# Patient Record
Sex: Male | Born: 1985 | Race: Black or African American | Hispanic: No | Marital: Single | Smoking: Former smoker
Health system: Southern US, Community
[De-identification: ages and names within clinical notes are randomized; demographics above are authoritative.]

## PROBLEM LIST (undated history)

## (undated) DIAGNOSIS — Y249XXA Unspecified firearm discharge, undetermined intent, initial encounter: Secondary | ICD-10-CM

## (undated) DIAGNOSIS — W3400XA Accidental discharge from unspecified firearms or gun, initial encounter: Secondary | ICD-10-CM

## (undated) DIAGNOSIS — R011 Cardiac murmur, unspecified: Secondary | ICD-10-CM

## (undated) DIAGNOSIS — Z21 Asymptomatic human immunodeficiency virus [HIV] infection status: Secondary | ICD-10-CM

## (undated) DIAGNOSIS — B20 Human immunodeficiency virus [HIV] disease: Secondary | ICD-10-CM

## (undated) HISTORY — DX: Unspecified firearm discharge, undetermined intent, initial encounter: Y24.9XXA

## (undated) HISTORY — DX: Accidental discharge from unspecified firearms or gun, initial encounter: W34.00XA

---
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
## 2005-03-16 ENCOUNTER — Emergency Department (HOSPITAL_COMMUNITY): Admission: EM | Admit: 2005-03-16 | Discharge: 2005-03-16 | Payer: Self-pay | Admitting: Emergency Medicine

## 2008-07-01 ENCOUNTER — Emergency Department (HOSPITAL_COMMUNITY): Admission: EM | Admit: 2008-07-01 | Discharge: 2008-07-01 | Payer: Self-pay | Admitting: Emergency Medicine

## 2008-09-15 ENCOUNTER — Emergency Department (HOSPITAL_COMMUNITY): Admission: EM | Admit: 2008-09-15 | Discharge: 2008-09-15 | Payer: Self-pay | Admitting: Emergency Medicine

## 2016-10-11 ENCOUNTER — Ambulatory Visit (HOSPITAL_COMMUNITY)
Admission: EM | Admit: 2016-10-11 | Discharge: 2016-10-11 | Disposition: A | Discharge status: Home / Self Care | Payer: Self-pay | Attending: Family Medicine | Admitting: Family Medicine

## 2016-10-11 ENCOUNTER — Encounter (HOSPITAL_COMMUNITY): Payer: Self-pay | Admitting: Emergency Medicine

## 2016-10-11 DIAGNOSIS — K0889 Other specified disorders of teeth and supporting structures: Secondary | ICD-10-CM

## 2016-10-11 MED ORDER — AMOXICILLIN-POT CLAVULANATE 875-125 MG PO TABS: 1.0000 | ORAL_TABLET | Freq: Two times a day (BID) | ORAL | 0 refills | Status: DC

## 2016-10-11 NOTE — ED Triage Notes (Signed)
PT reports dental pain and swelling for 1 week.

## 2016-10-12 NOTE — ED Provider Notes (Signed)
  [facility]   [id number] 10/11/16 Arrival Time: [id number]  ASSESSMENT & PLAN:  1. Pain, dental     Meds ordered this encounter  Medications  . amoxicillin-clavulanate (AUGMENTIN) 875-125 MG tablet    Sig: Take 1 tablet by mouth every 12 (twelve) hours.    Dispense:  14 tablet    Refill:  0   If not improving within 48 hours he plans to f/u with dentist. OTC analgesics.  Reviewed expectations re: course of current medical issues. Questions answered. Outlined signs and symptoms indicating need for more acute intervention. Patient verbalized understanding. After Visit Summary given.   SUBJECTIVE:  Mercury D Aull is a 31 y.o. male who presents with complaint of dental pain for the past week. Upper rear L tooth. Thinks gum is swollen. Normal PO intake. Pain worse with chewing. Afebrile. No drainage from area. No regular dental care. OTC analgesics with mild and temporary relief.  ROS: As per HPI.   OBJECTIVE:  Vitals:   10/11/16 1920  BP: 138/84  Pulse: (!) 57  Resp: 16  Temp: 98.9 F (37.2 C)  TempSrc: Oral  SpO2: 97%  Weight: 190 lb (86.2 kg)  Height: 5\' 4"  (1.626 m)     General appearance: alert; no distress HEENT: teeth in poor repair; tenderness over rear upper L gum; mild erythema; no fluctuant areas Neck: supple without LAD Psychological:  alert and cooperative; normal mood and affect  No Known Allergies  PMHx, SurgHx, SocialHx, Medications, and Allergies were reviewed in the Visit Navigator and updated as appropriate.      Mardella LaymanHagler, Cydni Reddoch, MD 10/12/16 1002

## 2016-10-21 ENCOUNTER — Emergency Department (HOSPITAL_COMMUNITY)
Admission: EM | Admit: 2016-10-21 | Discharge: 2016-10-21 | Disposition: A | Discharge status: Home / Self Care | Payer: Self-pay | Attending: Emergency Medicine | Admitting: Emergency Medicine

## 2016-10-21 ENCOUNTER — Encounter (HOSPITAL_COMMUNITY): Payer: Self-pay | Admitting: Emergency Medicine

## 2016-10-21 DIAGNOSIS — F17210 Nicotine dependence, cigarettes, uncomplicated: Secondary | ICD-10-CM | POA: Insufficient documentation

## 2016-10-21 DIAGNOSIS — M25531 Pain in right wrist: Secondary | ICD-10-CM | POA: Insufficient documentation

## 2016-10-21 NOTE — ED Notes (Signed)
Pt c/o left wrist pain. Works as Public affairs consultantdishwasher. Has had worsening pain the last 2 weeks.

## 2016-10-21 NOTE — ED Provider Notes (Addendum)
[facility] Provider Note   CSN: [id number] Arrival date & time: 10/21/16  1046     History   Chief Complaint Chief Complaint  Patient presents with  . Wrist Pain    HPI Roberto Parker is a 31 y.o. male.  The history is provided by the patient. No language interpreter was used.  Wrist Pain  This is a new problem. The current episode started more than 1 week ago. The problem occurs constantly. The problem has been gradually worsening. Pertinent negatives include no chest pain. Nothing aggravates the symptoms. Nothing relieves the symptoms. The treatment provided no relief.   Pt reports wrist soreness for the past 2 weeks,  Pain with turning wrist.  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 No pertinent past medical history.  There are no active problems to display for this patient.   History reviewed. No pertinent surgical history.     Home Medications    Prior to Admission medications   Medication Sig Start Date End Date Taking? Authorizing Provider  amoxicillin-clavulanate (AUGMENTIN) 875-125 MG tablet Take 1 tablet by mouth every 12 (twelve) hours. 10/11/16   Mardella LaymanHagler, Brian, MD    Family History History reviewed. No pertinent family history.  Social History Social History  Substance Use Topics  . Smoking status: Current Every Day Smoker    Packs/day: 0.50    Types: Cigarettes  . Smokeless tobacco: Never Used  . Alcohol use No     Comment: sometimes     Allergies   Patient has no known allergies.   Review of Systems Review of Systems  Cardiovascular: Negative for chest pain.  All other systems reviewed and are negative.    Physical Exam Updated Vital Signs BP (!) 141/84 (BP Location: Right Arm)   Pulse (!) 58   Temp 98.4 F (36.9 C) (Oral)   Resp 16   SpO2 99%   Physical Exam  Constitutional: He appears well-developed and well-nourished.  HENT:  Head: Normocephalic and atraumatic.  Eyes: Conjunctivae are normal.   Neck: Neck supple.  Cardiovascular: Normal rate and regular rhythm.   No murmur heard. Pulmonary/Chest: Effort normal and breath sounds normal. No respiratory distress.  Abdominal: Soft. There is no tenderness.  Musculoskeletal: He exhibits tenderness. He exhibits no edema.  Tender left wrist,  Pain with range of motion,  nv and ns intact.   Neurological: He is alert.  Skin: Skin is warm and dry.  Psychiatric: He has a normal mood and affect.  Nursing note and vitals reviewed.    ED Treatments / Results  Labs (all labs ordered are listed, but only abnormal results are displayed) Labs Reviewed - No data to display  EKG  EKG Interpretation None       Radiology No results found.  Procedures Procedures (including critical care time)  Medications Ordered in ED Medications - No data to display   Initial Impression / Assessment and Plan / ED Course  I have reviewed the triage vital signs and the nursing notes.  Pertinent labs & imaging results that were available during my care of the patient were reviewed by me and considered in my medical decision making (see chart for details).     Wrist splint   Pt placed in velcro splint by Rn Ice Rest Ibuprofen   Final Clinical Impressions(s) / ED Diagnoses   Final diagnoses:  Right wrist pain    New Prescriptions Discharge Medication List as of 10/21/2016 11:45 AM    An  After Visit Summary was printed and given to the patient.   Osie Cheeks 10/21/16 1253    Doug Sou, MD 10/21/16 1723    Elson Areas, PA-C 11/03/16 1645

## 2016-10-21 NOTE — ED Triage Notes (Signed)
Pt to ER for evaluation of left wrist aching pain onset 2 weeks ago. 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 Limited movement due to pain, no numbness. Reports relief of pain with wrist flexion.

## 2016-10-21 NOTE — Discharge Instructions (Signed)
Ibuprofen for pain,  Wear wrist splint

## 2018-05-13 ENCOUNTER — Encounter (HOSPITAL_COMMUNITY): Payer: Self-pay | Admitting: Emergency Medicine

## 2018-05-13 ENCOUNTER — Other Ambulatory Visit: Payer: Self-pay

## 2018-05-13 ENCOUNTER — Emergency Department (HOSPITAL_COMMUNITY)
Admission: EM | Admit: 2018-05-13 | Discharge: 2018-05-13 | Disposition: A | Discharge status: Home / Self Care | Payer: Self-pay | Attending: Emergency Medicine | Admitting: Emergency Medicine

## 2018-05-13 DIAGNOSIS — R0989 Other specified symptoms and signs involving the circulatory and respiratory systems: Secondary | ICD-10-CM | POA: Insufficient documentation

## 2018-05-13 DIAGNOSIS — F17210 Nicotine dependence, cigarettes, uncomplicated: Secondary | ICD-10-CM | POA: Insufficient documentation

## 2018-05-13 MED ORDER — ALBUTEROL SULFATE HFA 108 (90 BASE) MCG/ACT IN AERS: 1.0000 | INHALATION_SPRAY | Freq: Four times a day (QID) | RESPIRATORY_TRACT | 0 refills | Status: DC | PRN

## 2018-05-13 NOTE — Discharge Instructions (Signed)
You have been seen today for sore throat and chest congestion. Please read and follow all provided instructions. Return to the emergency room for worsening condition or new concerning symptoms.    1. Medications:  Prescription sent to your pharmacy for albuterol inhaler. This is for your shortness of breath.Please use as prescribed. Continue usual home medications. Take medications as prescribed. Please review all of the medicines and only take them if you do not have an allergy to them.  2. Treatment: rest, drink plenty of fluids 3. Follow Up: Please follow up with your primary doctor in 2-5 days for discussion of your diagnoses and further evaluation after today's visit; Call today to arrange your follow up.  If you do not have a primary care doctor use the resource guide provided to find one;   It is also a possibility that you have an allergic reaction to any of the medicines that you have been prescribed - Everybody reacts differently to medications and while MOST people have no trouble with most medicines, you may have a reaction such as nausea, vomiting, rash, swelling, shortness of breath. If this is the case, please stop taking the medicine immediately and contact your physician.  ?

## 2018-05-13 NOTE — ED Provider Notes (Signed)
[facility]-EMERGENCY DEPT Provider Note   CSN: [id number] Arrival date & time: 05/13/18  1104    History   Chief Complaint Chief Complaint  Patient presents with  . feels like something stuck in throat    HPI Roberto Parker is a 33 y.o. male otherwise healthy male presenting to emergency department today with chief complaint of sore throat x3 days.  More specifically patient says " it feels like something is stuck in my throat."  Patient reports he felt like he had the flu 2 weeks ago, did symptomatic treatment and mostly improved but has continued to have cough.  The cough is nonproductive, intermittent.  He states he still feels congested in his chest and has intermittent shortness of breath.  Denies modifying factors.  Patient describes this pain in his throat as feeling like there is something in it.  He reports when he swallows it feels like the liquid is going around an object instead of straight down like normal.  He is able to tolerate p.o. intake. He states he has to spit up a lot because of this feeling in his throat.  He has not taken any medication for his congestion prior to arrival.  He is unsure if there is a piece of chicken or food stuck in his throat. He denies fever, chills, nausea, vomiting. History provided by patient.     History reviewed. No pertinent past medical history.  There are no active problems to display for this patient.   History reviewed. No pertinent surgical history.      Home Medications    Prior to Admission medications   Medication Sig Start Date End Date Taking? Authorizing Provider  albuterol (PROVENTIL HFA;VENTOLIN HFA) 108 (90 Base) MCG/ACT inhaler Inhale 1-2 puffs into the lungs every 6 (six) hours as needed for wheezing or shortness of breath. 05/13/18   Albrizze, Kaitlyn E, PA-C  amoxicillin-clavulanate (AUGMENTIN) 875-125 MG tablet Take 1 tablet by mouth every 12 (twelve) hours. 10/11/16   Mardella Layman, MD     Family History No family history on file.  Social History Social History   Tobacco Use  . Smoking status: Current Every Day Smoker    Packs/day: 0.50    Types: Cigarettes  . Smokeless tobacco: Never Used  Substance Use Topics  . Alcohol use: No    Comment: sometimes  . Drug use: Yes    Types: Marijuana     Allergies   Patient has no known allergies.   Review of Systems Review of Systems  Constitutional: Negative for chills and fever.  HENT: Positive for congestion, sore throat and trouble swallowing. Negative for drooling, ear discharge, ear pain, facial swelling, sinus pain and voice change.   Respiratory: Positive for cough and shortness of breath. Negative for chest tightness.   Cardiovascular: Negative for chest pain.  Gastrointestinal: Negative for abdominal pain, diarrhea, nausea and vomiting.  All other systems reviewed and are negative.    Physical Exam Updated Vital Signs BP 132/83 (BP Location: Left Arm)   Pulse 66   Temp 98.7 F (37.1 C) (Oral)   Resp 20   SpO2 100%   Physical Exam Vitals signs and nursing note reviewed.  Constitutional:      Appearance: He is well-developed. He is not ill-appearing or toxic-appearing.  HENT:     Head: Normocephalic and atraumatic.     Nose: Nose normal.     Mouth/Throat:     Mouth: Mucous membranes are moist.  Pharynx: Oropharynx is clear. No oropharyngeal exudate or posterior oropharyngeal erythema.  Eyes:     General: No scleral icterus.       Right eye: No discharge.        Left eye: No discharge.     Conjunctiva/sclera: Conjunctivae normal.  Neck:     Musculoskeletal: Normal range of motion.  Cardiovascular:     Rate and Rhythm: Normal rate and regular rhythm.     Pulses: Normal pulses.     Heart sounds: Normal heart sounds.  Pulmonary:     Effort: Pulmonary effort is normal. No respiratory distress.     Breath sounds: No stridor. No wheezing, rhonchi or rales.     Comments: Lungs are clear  to auscultation in all fields.  Patient is speaking in full sentences, no accessory muscle use. Chest:     Chest wall: No tenderness.  Abdominal:     General: There is no distension.     Palpations: Abdomen is soft.  Musculoskeletal: Normal range of motion.  Skin:    General: Skin is warm and dry.  Neurological:     Mental Status: He is oriented to person, place, and time.     Comments: Fluent speech, no facial droop.  Psychiatric:        Behavior: Behavior normal.      ED Treatments / Results  Labs (all labs ordered are listed, but only abnormal results are displayed) Labs Reviewed - No data to display  EKG None  Radiology No results found.  Procedures Procedures (including critical care time)  Medications Ordered in ED Medications - No data to display   Initial Impression / Assessment and Plan / ED Course  I have reviewed the triage vital signs and the nursing notes.  Pertinent labs & imaging results that were available during my care of the patient were reviewed by me and considered in my medical decision making (see chart for details).   Patient is afebrile, well-appearing, no hypoxia.  He reports recent flu infection and has continued to have cough and chest congestion.  He thinks he has something stuck in his throat.  He admits to tolerating both p.o. fluid and food. DDX includes bronchitis, URI, less likely pneumonia, flu, food bolus impaction. patient states he does not want to stay for the x-ray.  He is frustrated that he has been here for 5 hours and states he has to leave to get back to work.  My work-up included a chest x-ray however he would rather leave before the imaging.  He is concerned for this ongoing congestion and wants treatment.  I recommended over-the-counter options such as a Mucinex cough and chest congestion or Mucinex sinus.  Prescription sent at discharge for albuterol inhaler given his intermittent shortness of breath. Patient is  hemodynamically stable, in NAD, and able to ambulate in the ED. Patient is comfortable with above plan and is stable for discharge at this time. All questions were answered prior to disposition. Strict return precautions for returning to the ED were discussed. Encouraged follow up with PCP.      Final Clinical Impressions(s) / ED Diagnoses   Final diagnoses:  Chest congestion    ED Discharge Orders         Ordered    albuterol (PROVENTIL HFA;VENTOLIN HFA) 108 (90 Base) MCG/ACT inhaler  Every 6 hours PRN     05/13/18 1623           Sherene Sires, PA-C 05/13/18 1902  Samuel Jester, DO 05/18/18 1843

## 2018-05-13 NOTE — ED Triage Notes (Signed)
Pt reports feels like something is stuck in his throat for a couple days after eating a piece of chicken. Reports "been spitting up cold and when I lay down feels like something coming up throat".

## 2018-10-23 ENCOUNTER — Other Ambulatory Visit: Payer: Self-pay

## 2018-10-23 ENCOUNTER — Emergency Department (HOSPITAL_COMMUNITY)
Admission: EM | Admit: 2018-10-23 | Discharge: 2018-10-23 | Disposition: A | Discharge status: Home / Self Care | Payer: Self-pay | Attending: Emergency Medicine | Admitting: Emergency Medicine

## 2018-10-23 ENCOUNTER — Emergency Department (HOSPITAL_COMMUNITY): Payer: Self-pay

## 2018-10-23 ENCOUNTER — Encounter (HOSPITAL_COMMUNITY): Payer: Self-pay | Admitting: Emergency Medicine

## 2018-10-23 DIAGNOSIS — W3400XA Accidental discharge from unspecified firearms or gun, initial encounter: Secondary | ICD-10-CM | POA: Insufficient documentation

## 2018-10-23 DIAGNOSIS — Y939 Activity, unspecified: Secondary | ICD-10-CM | POA: Insufficient documentation

## 2018-10-23 DIAGNOSIS — Y929 Unspecified place or not applicable: Secondary | ICD-10-CM | POA: Insufficient documentation

## 2018-10-23 DIAGNOSIS — Y999 Unspecified external cause status: Secondary | ICD-10-CM | POA: Insufficient documentation

## 2018-10-23 DIAGNOSIS — Z9189 Other specified personal risk factors, not elsewhere classified: Secondary | ICD-10-CM

## 2018-10-23 DIAGNOSIS — F17210 Nicotine dependence, cigarettes, uncomplicated: Secondary | ICD-10-CM | POA: Insufficient documentation

## 2018-10-23 DIAGNOSIS — S71132A Puncture wound without foreign body, left thigh, initial encounter: Secondary | ICD-10-CM | POA: Insufficient documentation
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
## 2019-02-20 ENCOUNTER — Encounter (HOSPITAL_COMMUNITY): Payer: Self-pay | Admitting: Emergency Medicine

## 2019-02-20 ENCOUNTER — Ambulatory Visit (HOSPITAL_COMMUNITY)
Admission: EM | Admit: 2019-02-20 | Discharge: 2019-02-20 | Disposition: A | Discharge status: Home / Self Care | Payer: Self-pay | Attending: Family Medicine | Admitting: Family Medicine

## 2019-02-20 ENCOUNTER — Other Ambulatory Visit: Payer: Self-pay

## 2019-02-20 DIAGNOSIS — L089 Local infection of the skin and subcutaneous tissue, unspecified: Secondary | ICD-10-CM

## 2019-02-20 DIAGNOSIS — B9689 Other specified bacterial agents as the cause of diseases classified elsewhere: Secondary | ICD-10-CM

## 2019-02-20 DIAGNOSIS — B353 Tinea pedis: Secondary | ICD-10-CM

## 2019-02-20 HISTORY — DX: Cardiac murmur, unspecified: R01.1

## 2019-02-20 MED ORDER — DOXYCYCLINE HYCLATE 100 MG PO CAPS: 100.0000 mg | ORAL_CAPSULE | Freq: Two times a day (BID) | ORAL | 0 refills | Status: DC

## 2019-02-20 MED ORDER — CLOTRIMAZOLE 1 % EX CREA: TOPICAL_CREAM | CUTANEOUS | 0 refills | Status: DC

## 2019-02-20 NOTE — ED Provider Notes (Signed)
[facility]   [id number] 02/20/19 Arrival Time: 1443  ASSESSMENT & PLAN:  1. Tinea pedis of right foot   2. Bacterial skin infection     No indication for imaging of foot at this time. No trauma reported. Discussed. Suspect tinea pedis with developing cellulitis.  Begin: Meds ordered this encounter  Medications  . doxycycline (VIBRAMYCIN) 100 MG capsule    Sig: Take 1 capsule (100 mg total) by mouth 2 (two) times daily.    Dispense:  20 capsule    Refill:  0  . clotrimazole (LOTRIMIN) 1 % cream    Sig: Apply to affected area 2 times daily for athletes foot.    Dispense:  15 g    Refill:  0    Close home observation.  Recommend: Follow-up Information    [facility].   Specialty: Urgent Care Why: If worsening or failing to improve as anticipated. Contact information: [address] [place] [address] [phone]          OTC analgesics if needed. Reviewed expectations re: course of current medical issues. Questions answered. Outlined signs and symptoms indicating need for more acute intervention. Patient verbalized understanding. After Visit Summary given.  SUBJECTIVE: History from: patient. Roberto Parker is a 33 y.o. male who reports pain and swelling around his R fifth toe. First noted approx 2 days ago. "Think there's a small cut under my toe". No bleeding or drainage. Afebrile. No injury/trauma reported. Afebrile. Wearing his normal shoes. Ambulatory without difficulty. No specific aggravating or alleviating factors reported. No extremity sensation changes or weakness. No home treatment. Some itching between toes; sporadic; "for awhile".  History reviewed. No pertinent surgical history.   ROS: As per HPI. All other systems negative.    OBJECTIVE:  Vitals:   02/20/19 1640  BP: 116/72  Pulse: (!) 59  Resp: 18  Temp: 98.6 F (37 C)  TempSrc: Oral  SpO2: 97%    General appearance:  alert; no distress HEENT: [place]; AT Neck: supple with FROM Resp: unlabored respirations Extremities: . RLE: warm with well perfused appearance; fairly well localized mild tenderness around the base of his R 5th toe with dry and slightly macerated skin; no bleeding or drainage; skin around toe with mild erythema and significant warmth to touch; normal capillary refill and distal sensation; no bruising Skin: warm and dry; no visible rashes (except as noted above) Neurologic: gait normal; normal reflexes of RLE; normal sensation of RLE; normal strength of RLE Psychological: alert and cooperative; normal mood and affect   No Known Allergies  Past Medical History:  Diagnosis Date  . Murmur    Social History   Socioeconomic History  . Marital status: Single    Spouse name: Not on file  . Number of children: Not on file  . Years of education: Not on file  . Highest education level: Not on file  Occupational History  . Not on file  Tobacco Use  . Smoking status: Former Smoker    Packs/day: 0.50    Types: Cigarettes  . Smokeless tobacco: Never Used  Substance and Sexual Activity  . Alcohol use: No    Comment: sometimes  . Drug use: Yes    Types: Marijuana  . Sexual activity: Not on file  Other Topics Concern  . Not on file  Social History Narrative  . Not on file   Social Determinants of Health   Financial Resource Strain:   . Difficulty  of Paying Living Expenses: Not on file  Food Insecurity:   . Worried About Charity fundraiser in the Last Year: Not on file  . Ran Out of Food in the Last Year: Not on file  Transportation Needs:   . Lack of Transportation (Medical): Not on file  . Lack of Transportation (Non-Medical): Not on file  Physical Activity:   . Days of Exercise per Week: Not on file  . Minutes of Exercise per Session: Not on file  Stress:   . Feeling of Stress : Not on file  Social Connections:   . Frequency of Communication with Friends and Family: Not on  file  . Frequency of Social Gatherings with Friends and Family: Not on file  . Attends Religious Services: Not on file  . Active Member of Clubs or Organizations: Not on file  . Attends Archivist Meetings: Not on file  . Marital Status: Not on file   Family History  Problem Relation Age of Onset  . Healthy Mother   . Healthy Father    History reviewed. No pertinent surgical history.    Vanessa Kick, MD 02/20/19 [phone]

## 2019-02-20 NOTE — ED Triage Notes (Signed)
Small cut reported under left toe, pain for 2 days.  Increased swelling today

## 2019-05-02 ENCOUNTER — Ambulatory Visit (HOSPITAL_COMMUNITY)
Admission: EM | Admit: 2019-05-02 | Discharge: 2019-05-02 | Disposition: A | Discharge status: Home / Self Care | Payer: Self-pay | Attending: Family Medicine | Admitting: Family Medicine

## 2019-05-02 ENCOUNTER — Encounter (HOSPITAL_COMMUNITY): Payer: Self-pay | Admitting: Emergency Medicine

## 2019-05-02 ENCOUNTER — Other Ambulatory Visit: Payer: Self-pay

## 2019-05-02 DIAGNOSIS — Z113 Encounter for screening for infections with a predominantly sexual mode of transmission: Secondary | ICD-10-CM | POA: Insufficient documentation

## 2019-05-02 DIAGNOSIS — R220 Localized swelling, mass and lump, head: Secondary | ICD-10-CM | POA: Insufficient documentation

## 2019-05-02 LAB — HIV ANTIBODY (ROUTINE TESTING W REFLEX): HIV Screen 4th Generation wRfx: REACTIVE — AB

## 2019-05-02 NOTE — Discharge Instructions (Addendum)
Your STD tests are pending.  If your test results are positive, we will call you.  You may need additional treatment and your partner(s) may also need treatment.      

## 2019-05-02 NOTE — ED Provider Notes (Signed)
[facility]    CSN: [id number] Arrival date & time: 05/02/19  1146      History   Chief Complaint Chief Complaint  Patient presents with  . Lip Problem    HPI Roberto Parker is a 34 y.o. male.   Reports that he had oral sex with a partner x2 days ago and noticed that 1 day ago he had a lesion on his bottom left lip.  Reports that it burns at times.  He has been keeping Vaseline on it to keep it moisturized.  Reports that he did not see any lesions on his partner at the time.  Requesting further STD testing today as well.  Denies fever, nausea, vomiting, diarrhea, headache, sore throat, shortness of breath, rash, other symptoms.  ROS per HPI  The history is provided by the patient.    Past Medical History:  Diagnosis Date  . Murmur     There are no problems to display for this patient.   History reviewed. No pertinent surgical history.     Home Medications    Prior to Admission medications   Medication Sig Start Date End Date Taking? Authorizing Provider  albuterol (PROVENTIL HFA;VENTOLIN HFA) 108 (90 Base) MCG/ACT inhaler Inhale 1-2 puffs into the lungs every 6 (six) hours as needed for wheezing or shortness of breath. Patient not taking: Reported on 10/23/2018 05/13/18   Albrizze, Caroleen Hamman, PA-C  clotrimazole (LOTRIMIN) 1 % cream Apply to affected area 2 times daily for athletes foot. 02/20/19   Mardella Layman, MD  doxycycline (VIBRAMYCIN) 100 MG capsule Take 1 capsule (100 mg total) by mouth 2 (two) times daily. 02/20/19   Mardella Layman, MD    Family History Family History  Problem Relation Age of Onset  . Healthy Mother   . Healthy Father     Social History Social History   Tobacco Use  . Smoking status: Former Smoker    Packs/day: 0.50    Types: Cigarettes  . Smokeless tobacco: Never Used  Substance Use Topics  . Alcohol use: No    Comment: sometimes  . Drug use: Yes    Types: Marijuana     Allergies   Patient has no known  allergies.   Review of Systems Review of Systems   Physical Exam Triage Vital Signs ED Triage Vitals [05/02/19 1238]  Enc Vitals Group     BP 136/81     Pulse Rate 79     Resp 18     Temp 98.6 F (37 C)     Temp Source Oral     SpO2 100 %     Weight      Height      Head Circumference      Peak Flow      Pain Score 0     Pain Loc      Pain Edu?      Excl. in GC?    No data found.  Updated Vital Signs BP 136/81   Pulse 79   Temp 98.6 F (37 C) (Oral)   Resp 18   SpO2 100%   Visual Acuity Right Eye Distance:   Left Eye Distance:   Bilateral Distance:    Right Eye Near:   Left Eye Near:    Bilateral Near:     Physical Exam Vitals and nursing note reviewed.  Constitutional:      Appearance: He is well-developed.  HENT:     Head: Normocephalic and atraumatic.  Comments: Lesion swabbed    Nose: Nose normal.     Mouth/Throat:     Lips: Lesions present.     Mouth: Mucous membranes are moist.   Eyes:     Conjunctiva/sclera: Conjunctivae normal.  Cardiovascular:     Rate and Rhythm: Normal rate and regular rhythm.     Heart sounds: No murmur.  Pulmonary:     Effort: Pulmonary effort is normal. No respiratory distress.     Breath sounds: Normal breath sounds.  Abdominal:     General: Bowel sounds are normal.     Palpations: Abdomen is soft.     Tenderness: There is no abdominal tenderness.  Musculoskeletal:     Cervical back: Neck supple.  Skin:    General: Skin is warm and dry.     Capillary Refill: Capillary refill takes less than 2 seconds.  Neurological:     General: No focal deficit present.     Mental Status: He is alert and oriented to person, place, and time.  Psychiatric:        Mood and Affect: Mood normal.        Behavior: Behavior normal.      UC Treatments / Results  Labs (all labs ordered are listed, but only abnormal results are displayed) Labs Reviewed  HSV CULTURE AND TYPING  RPR  HIV ANTIBODY (ROUTINE TESTING W  REFLEX)  CYTOLOGY, (ORAL, ANAL, URETHRAL) ANCILLARY ONLY    EKG   Radiology No results found.  Procedures Procedures (including critical care time)  Medications Ordered in UC Medications - No data to display  Initial Impression / Assessment and Plan / UC Course  I have reviewed the triage vital signs and the nursing notes.  Pertinent labs & imaging results that were available during my care of the patient were reviewed by me and considered in my medical decision making (see chart for details).    Herpes swab to left lower lip.  Will call with results.  Abdomen swab obtained, will call with results.  Instructed to abstain from sexual intercourse or oral sex, until results are back.  Did not treat today, if anything comes back positive we will treat accordingly.  Instructed on when to go to the ER if needed. Final Clinical Impressions(s) / UC Diagnoses   Final diagnoses:  Screening for STDs (sexually transmitted diseases)  Lip swelling     Discharge Instructions     Your STD tests are pending.  If your test results are positive, we will call you.  You may need additional treatment and your partner(s) may also need treatment.         ED Prescriptions    None     I have reviewed the PDMP during this encounter.   Faustino Congress, NP 05/02/19 1354

## 2019-05-02 NOTE — ED Triage Notes (Addendum)
Pt c/o lower lip irritation since yesterday. Pt states it popped up after he had "sexual relations with somebody". Pt requesting all std testing.

## 2019-05-03 LAB — CYTOLOGY, (ORAL, ANAL, URETHRAL) ANCILLARY ONLY
Chlamydia: NEGATIVE
Neisseria Gonorrhea: NEGATIVE
Trichomonas: NEGATIVE

## 2019-05-03 LAB — RPR: RPR Ser Ql: NONREACTIVE

## 2019-05-05 ENCOUNTER — Telehealth: Payer: Self-pay | Admitting: *Deleted

## 2019-05-05 LAB — HSV CULTURE AND TYPING

## 2019-05-05 LAB — HIV-1/2 AB - DIFFERENTIATION
HIV 1 Ab: POSITIVE — AB
HIV 2 Ab: NEGATIVE

## 2019-05-05 NOTE — Telephone Encounter (Signed)
Sent referral to [facility]. Sheena Simonis M, RN  

## 2019-05-05 NOTE — Telephone Encounter (Signed)
-----   Message from Gardiner Barefoot, MD sent at 05/05/2019  2:55 PM EST ----- New positive B20.  Needs to be sent to [facility].  thanks

## 2019-05-06 ENCOUNTER — Telehealth: Payer: Self-pay | Admitting: *Deleted

## 2019-05-06 DIAGNOSIS — Z79899 Other long term (current) drug therapy: Secondary | ICD-10-CM

## 2019-05-06 DIAGNOSIS — Z113 Encounter for screening for infections with a predominantly sexual mode of transmission: Secondary | ICD-10-CM

## 2019-05-06 DIAGNOSIS — B20 Human immunodeficiency virus [HIV] disease: Secondary | ICD-10-CM

## 2019-05-06 NOTE — Telephone Encounter (Signed)
Patient called for an appointment, states he was given our phone number from Urgent Care and [facility] after he was told he was HIV+.  RN spoke with patient, answered a few of his questions, scheduled him for NEW B20 appointments. Transferred him to [facility] [place] for advice on what to bring to his financial counseling appointment. 05/07/19 - financial and labs,  05/21/19 - Dr Orvan Falconer and [facility]. Patient given our phone number and address. Lab orders placed. Andree Coss, RN

## 2019-05-07 ENCOUNTER — Other Ambulatory Visit: Payer: Self-pay

## 2019-05-07 ENCOUNTER — Encounter: Payer: Self-pay | Admitting: Internal Medicine

## 2019-05-07 ENCOUNTER — Ambulatory Visit: Payer: Self-pay

## 2019-05-07 DIAGNOSIS — Z113 Encounter for screening for infections with a predominantly sexual mode of transmission: Secondary | ICD-10-CM

## 2019-05-07 DIAGNOSIS — Z79899 Other long term (current) drug therapy: Secondary | ICD-10-CM

## 2019-05-07 DIAGNOSIS — B20 Human immunodeficiency virus [HIV] disease: Secondary | ICD-10-CM

## 2019-05-07 LAB — URINALYSIS
Bilirubin Urine: NEGATIVE
Glucose, UA: NEGATIVE
Hgb urine dipstick: NEGATIVE
Ketones, ur: NEGATIVE
Nitrite: NEGATIVE
Protein, ur: NEGATIVE
Specific Gravity, Urine: 1.012 (ref 1.001–1.03)
pH: 5.5 (ref 5.0–8.0)

## 2019-05-08 LAB — T-HELPER CELL (CD4) - (RCID CLINIC ONLY)
CD4 % Helper T Cell: 21 % — ABNORMAL LOW (ref 33–65)
CD4 T Cell Abs: 451 /uL (ref 400–1790)

## 2019-05-08 LAB — URINE CYTOLOGY ANCILLARY ONLY
Chlamydia: NEGATIVE
Comment: NEGATIVE
Comment: NORMAL
Neisseria Gonorrhea: NEGATIVE

## 2019-05-16 LAB — CBC WITH DIFFERENTIAL/PLATELET
Absolute Monocytes: 397 cells/uL (ref 200–950)
Basophils Absolute: 12 cells/uL (ref 0–200)
Basophils Relative: 0.2 %
Eosinophils Absolute: 31 cells/uL (ref 15–500)
Eosinophils Relative: 0.5 %
HCT: 39.9 % (ref 38.5–50.0)
Hemoglobin: 13.5 g/dL (ref 13.2–17.1)
Lymphs Abs: 2318 cells/uL (ref 850–3900)
MCH: 31.1 pg (ref 27.0–33.0)
MCHC: 33.8 g/dL (ref 32.0–36.0)
MCV: 91.9 fL (ref 80.0–100.0)
MPV: 10.3 fL (ref 7.5–12.5)
Monocytes Relative: 6.5 %
Neutro Abs: 3343 cells/uL (ref 1500–7800)
Neutrophils Relative %: 54.8 %
Platelets: 230 10*3/uL (ref 140–400)
RBC: 4.34 10*6/uL (ref 4.20–5.80)
RDW: 13.9 % (ref 11.0–15.0)
Total Lymphocyte: 38 %
WBC: 6.1 10*3/uL (ref 3.8–10.8)

## 2019-05-16 LAB — COMPLETE METABOLIC PANEL WITH GFR
AG Ratio: 0.8 (calc) — ABNORMAL LOW (ref 1.0–2.5)
ALT: 27 U/L (ref 9–46)
AST: 31 U/L (ref 10–40)
Albumin: 3.7 g/dL (ref 3.6–5.1)
Alkaline phosphatase (APISO): 67 U/L (ref 36–130)
BUN: 14 mg/dL (ref 7–25)
CO2: 23 mmol/L (ref 20–32)
Calcium: 8.8 mg/dL (ref 8.6–10.3)
Chloride: 106 mmol/L (ref 98–110)
Creat: 1.03 mg/dL (ref 0.60–1.35)
GFR, Est African American: 109 mL/min/{1.73_m2} (ref >=60)
GFR, Est Non African American: 94 mL/min/{1.73_m2} (ref >=60)
Globulin: 4.4 g/dL (calc) — ABNORMAL HIGH (ref 1.9–3.7)
Glucose, Bld: 147 mg/dL — ABNORMAL HIGH (ref 65–99)
Potassium: 3.8 mmol/L (ref 3.5–5.3)
Sodium: 135 mmol/L (ref 135–146)
Total Bilirubin: 0.3 mg/dL (ref 0.2–1.2)
Total Protein: 8.1 g/dL (ref 6.1–8.1)

## 2019-05-16 LAB — HIV-1 RNA ULTRAQUANT REFLEX TO GENTYP+
HIV 1 RNA Quant: 24300 copies/mL — ABNORMAL HIGH
HIV-1 RNA Quant, Log: 4.39 Log copies/mL — ABNORMAL HIGH

## 2019-05-16 LAB — LIPID PANEL
Cholesterol: 135 mg/dL (ref <200)
HDL: 38 mg/dL — ABNORMAL LOW (ref >=40)
LDL Cholesterol (Calc): 77 mg/dL (calc)
Non-HDL Cholesterol (Calc): 97 mg/dL (calc) (ref <130)
Total CHOL/HDL Ratio: 3.6 (calc) (ref <5.0)
Triglycerides: 110 mg/dL (ref <150)

## 2019-05-16 LAB — QUANTIFERON-TB GOLD PLUS
Mitogen-NIL: >10 IU/mL
NIL: 0.08 IU/mL
QuantiFERON-TB Gold Plus: NEGATIVE
TB1-NIL: 0.01 IU/mL
TB2-NIL: <0 IU/mL

## 2019-05-16 LAB — HIV-1 GENOTYPE: HIV-1 Genotype: DETECTED — AB

## 2019-05-16 LAB — RPR: RPR Ser Ql: NONREACTIVE

## 2019-05-16 LAB — HIV ANTIBODY (ROUTINE TESTING W REFLEX): HIV 1&2 Ab, 4th Generation: REACTIVE — AB

## 2019-05-16 LAB — HLA B*5701: HLA-B*5701 w/rflx HLA-B High: NEGATIVE

## 2019-05-16 LAB — HIV-1/2 AB - DIFFERENTIATION
HIV-1 antibody: POSITIVE — AB
HIV-2 Ab: NEGATIVE

## 2019-05-16 LAB — HEPATITIS B CORE ANTIBODY, TOTAL: Hep B Core Total Ab: NONREACTIVE

## 2019-05-16 LAB — HEPATITIS B SURFACE ANTIGEN: Hepatitis B Surface Ag: NONREACTIVE

## 2019-05-16 LAB — HEPATITIS B SURFACE ANTIBODY,QUALITATIVE: Hep B S Ab: REACTIVE — AB

## 2019-05-16 LAB — HEPATITIS C ANTIBODY
Hepatitis C Ab: NONREACTIVE
SIGNAL TO CUT-OFF: 0.13 (ref <1.00)

## 2019-05-16 LAB — HEPATITIS A ANTIBODY, TOTAL: Hepatitis A AB,Total: NONREACTIVE

## 2019-05-20 ENCOUNTER — Telehealth: Payer: Self-pay

## 2019-05-20 NOTE — Telephone Encounter (Signed)
COVID-19 Pre-Screening Questions:05/20/19  Do you currently have a fever (>100 F), chills or unexplained body aches?NO  Are you currently experiencing new cough, shortness of breath, sore throat, runny nose? NO .  Have you recently travelled outside the state of [place] in the last 14 days?NO .  Have you been in contact with someone that is currently pending confirmation of Covid19 testing or has been confirmed to have the Covid19 virus?  NO  **If the patient answers NO to ALL questions -  advise the patient to please call the clinic before coming to the office should any symptoms develop.     

## 2019-05-21 ENCOUNTER — Encounter: Payer: Self-pay | Admitting: Internal Medicine

## 2019-05-21 ENCOUNTER — Ambulatory Visit (INDEPENDENT_AMBULATORY_CARE_PROVIDER_SITE_OTHER): Payer: Self-pay | Admitting: Pharmacist

## 2019-05-21 ENCOUNTER — Telehealth: Payer: Self-pay | Admitting: Pharmacy Technician

## 2019-05-21 ENCOUNTER — Ambulatory Visit (INDEPENDENT_AMBULATORY_CARE_PROVIDER_SITE_OTHER): Payer: Self-pay | Admitting: Internal Medicine

## 2019-05-21 ENCOUNTER — Other Ambulatory Visit: Payer: Self-pay

## 2019-05-21 DIAGNOSIS — B001 Herpesviral vesicular dermatitis: Secondary | ICD-10-CM

## 2019-05-21 DIAGNOSIS — B20 Human immunodeficiency virus [HIV] disease: Secondary | ICD-10-CM

## 2019-05-21 DIAGNOSIS — R739 Hyperglycemia, unspecified: Secondary | ICD-10-CM

## 2019-05-21 DIAGNOSIS — F17210 Nicotine dependence, cigarettes, uncomplicated: Secondary | ICD-10-CM | POA: Insufficient documentation

## 2019-05-21 DIAGNOSIS — F1490 Cocaine use, unspecified, uncomplicated: Secondary | ICD-10-CM

## 2019-05-21 DIAGNOSIS — S71132D Puncture wound without foreign body, left thigh, subsequent encounter: Secondary | ICD-10-CM | POA: Insufficient documentation

## 2019-05-21 DIAGNOSIS — E785 Hyperlipidemia, unspecified: Secondary | ICD-10-CM | POA: Insufficient documentation

## 2019-05-21 DIAGNOSIS — W3400XD Accidental discharge from unspecified firearms or gun, subsequent encounter: Secondary | ICD-10-CM

## 2019-05-21 MED ORDER — BICTEGRAVIR-EMTRICITAB-TENOFOV 50-200-25 MG PO TABS: 1.0000 | ORAL_TABLET | Freq: Every day | ORAL | 11 refills | Status: DC

## 2019-05-21 NOTE — Patient Instructions (Signed)
Renew Funding every January and July.
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
## 2019-05-21 NOTE — Assessment & Plan Note (Signed)
He has recently diagnosed asymptomatic HIV infection.  His CD4 count remains in the normal range and his viral load is relatively low.  I talked to him about treatment options and will start him on Biktarvy today.  He will be able to get a supply from his pharmacy today and should be certified for HMAP in the next day or 2.  I talked to him about the utmost importance of adherence and not missing doses.  He says that he does not think that that will be a problem.  He plans on taking Biktarvy each morning with his breakfast.  I had him meet with our ID pharmacist, Cassie to reinforce adherence.  I talked to him about the importance of partners he has very thankful about who they are having them screened for STDs and using condoms.  He will follow-up for repeat lab work in 6 weeks.

## 2019-05-21 NOTE — Telephone Encounter (Signed)
[facility] Patient Advocate Encounter ° °Insurance verification completed.   ° °The patient is uninsured and will need patient assistance for medication. ° °We can complete the application and will need to meet with the patient for signatures and income documentation. ° °Trentin Knappenberger E. Kyrollos Cordell, CPhT °Specialty Pharmacy Patient Advocate °[facility] °Phone: [phone] °Fax:  [phone] ° ° °

## 2019-05-21 NOTE — Telephone Encounter (Addendum)
[facility] Patient Advocate Encounter  Completed and sent [employer] Advancing Access application for Biktarvy for this patient who is uninsured.    Patient is approved 05/21/2019 through 06/21/2019.  BIN      [id number] PCN    [id number] GRP    [id number] ID        [id number]  The information has been shared with [facility], [place].  This will bridge him until he is HMAP approved.   Netty Starring. Dimas Aguas CPhT Specialty Pharmacy Patient [facility] Phone: [phone] Fax:  [phone]

## 2019-05-21 NOTE — Assessment & Plan Note (Signed)
I encouraged him to reconsider the adverse impact of marijuana and cocaine on his health and decision-making.  That he does not think he will be a problem stop pain both.

## 2019-05-21 NOTE — Assessment & Plan Note (Signed)
He recently started back smoking cigarettes quit exercising.  He says that he thinks it will be fairly easy for him to quit again.

## 2019-05-23 ENCOUNTER — Other Ambulatory Visit: Payer: Self-pay

## 2019-05-23 DIAGNOSIS — B20 Human immunodeficiency virus [HIV] disease: Secondary | ICD-10-CM

## 2019-05-23 MED ORDER — BICTEGRAVIR-EMTRICITAB-TENOFOV 50-200-25 MG PO TABS: 1.0000 | ORAL_TABLET | Freq: Every day | ORAL | 5 refills | Status: DC

## 2019-05-23 NOTE — Telephone Encounter (Signed)
Patient called to say the pharmacy needed the name of his provider for future refill.  I submitted information to pharmacy along with refills.   Laurell Josephs, RN

## 2019-06-01 ENCOUNTER — Other Ambulatory Visit: Payer: Self-pay

## 2019-06-01 ENCOUNTER — Emergency Department (HOSPITAL_COMMUNITY): Payer: No Typology Code available for payment source

## 2019-06-01 ENCOUNTER — Emergency Department (HOSPITAL_COMMUNITY)
Admission: EM | Admit: 2019-06-01 | Discharge: 2019-06-02 | Disposition: A | Discharge status: Home / Self Care | Payer: No Typology Code available for payment source | Attending: Emergency Medicine | Admitting: Emergency Medicine

## 2019-06-01 ENCOUNTER — Encounter (HOSPITAL_COMMUNITY): Payer: Self-pay | Admitting: *Deleted

## 2019-06-01 DIAGNOSIS — F10920 Alcohol use, unspecified with intoxication, uncomplicated: Secondary | ICD-10-CM | POA: Diagnosis present

## 2019-06-01 DIAGNOSIS — F17210 Nicotine dependence, cigarettes, uncomplicated: Secondary | ICD-10-CM | POA: Diagnosis not present

## 2019-06-01 DIAGNOSIS — B20 Human immunodeficiency virus [HIV] disease: Secondary | ICD-10-CM | POA: Insufficient documentation

## 2019-06-01 DIAGNOSIS — Y906 Blood alcohol level of 120-199 mg/100 ml: Secondary | ICD-10-CM | POA: Insufficient documentation

## 2019-06-01 DIAGNOSIS — F32A Depression, unspecified: Secondary | ICD-10-CM

## 2019-06-01 DIAGNOSIS — Z20822 Contact with and (suspected) exposure to covid-19: Secondary | ICD-10-CM | POA: Insufficient documentation

## 2019-06-01 DIAGNOSIS — F4325 Adjustment disorder with mixed disturbance of emotions and conduct: Secondary | ICD-10-CM | POA: Diagnosis not present

## 2019-06-01 DIAGNOSIS — Z79899 Other long term (current) drug therapy: Secondary | ICD-10-CM | POA: Insufficient documentation

## 2019-06-01 DIAGNOSIS — F329 Major depressive disorder, single episode, unspecified: Secondary | ICD-10-CM | POA: Diagnosis not present

## 2019-06-01 LAB — CBC WITH DIFFERENTIAL/PLATELET
Abs Immature Granulocytes: 0.02 10*3/uL (ref 0.00–0.07)
Basophils Absolute: 0 10*3/uL (ref 0.0–0.1)
Basophils Relative: 0 %
Eosinophils Absolute: 0 10*3/uL (ref 0.0–0.5)
Eosinophils Relative: 0 %
HCT: 44.8 % (ref 39.0–52.0)
Hemoglobin: 14.9 g/dL (ref 13.0–17.0)
Immature Granulocytes: 0 %
Lymphocytes Relative: 38 %
Lymphs Abs: 2.7 10*3/uL (ref 0.7–4.0)
MCH: 31 pg (ref 26.0–34.0)
MCHC: 33.3 g/dL (ref 30.0–36.0)
MCV: 93.3 fL (ref 80.0–100.0)
Monocytes Absolute: 0.4 10*3/uL (ref 0.1–1.0)
Monocytes Relative: 5 %
Neutro Abs: 4.1 10*3/uL (ref 1.7–7.7)
Neutrophils Relative %: 57 %
Platelets: 239 10*3/uL (ref 150–400)
RBC: 4.8 MIL/uL (ref 4.22–5.81)
RDW: 13.3 % (ref 11.5–15.5)
WBC: 7.2 10*3/uL (ref 4.0–10.5)
nRBC: 0 % (ref 0.0–0.2)

## 2019-06-01 LAB — RESPIRATORY PANEL BY RT PCR (FLU A&B, COVID)
Influenza A by PCR: NEGATIVE
Influenza B by PCR: NEGATIVE
SARS Coronavirus 2 by RT PCR: NEGATIVE

## 2019-06-01 LAB — RAPID URINE DRUG SCREEN, HOSP PERFORMED
Amphetamines: NOT DETECTED
Barbiturates: NOT DETECTED
Benzodiazepines: POSITIVE — AB
Cocaine: POSITIVE — AB
Opiates: NOT DETECTED
Tetrahydrocannabinol: POSITIVE — AB

## 2019-06-01 LAB — COMPREHENSIVE METABOLIC PANEL
ALT: 24 U/L (ref 0–44)
AST: 36 U/L (ref 15–41)
Albumin: 3.9 g/dL (ref 3.5–5.0)
Alkaline Phosphatase: 68 U/L (ref 38–126)
Anion gap: 13 (ref 5–15)
BUN: 9 mg/dL (ref 6–20)
CO2: 24 mmol/L (ref 22–32)
Calcium: 9.3 mg/dL (ref 8.9–10.3)
Chloride: 102 mmol/L (ref 98–111)
Creatinine, Ser: 0.98 mg/dL (ref 0.61–1.24)
GFR calc Af Amer: >60 mL/min (ref >60)
GFR calc non Af Amer: >60 mL/min (ref >60)
Glucose, Bld: 90 mg/dL (ref 70–99)
Potassium: 4.5 mmol/L (ref 3.5–5.1)
Sodium: 139 mmol/L (ref 135–145)
Total Bilirubin: 0.7 mg/dL (ref 0.3–1.2)
Total Protein: 9.5 g/dL — ABNORMAL HIGH (ref 6.5–8.1)

## 2019-06-01 LAB — ETHANOL: Alcohol, Ethyl (B): 127 mg/dL — ABNORMAL HIGH (ref <10)

## 2019-06-01 LAB — ACETAMINOPHEN LEVEL: Acetaminophen (Tylenol), Serum: <10 ug/mL — ABNORMAL LOW (ref 10–30)

## 2019-06-01 LAB — SALICYLATE LEVEL: Salicylate Lvl: <7 mg/dL — ABNORMAL LOW (ref 7.0–30.0)

## 2019-06-01 MED ORDER — LORAZEPAM 2 MG/ML IJ SOLN: 0.0000 mg | Freq: Two times a day (BID) | INTRAMUSCULAR | Status: DC

## 2019-06-01 MED ORDER — THIAMINE HCL 100 MG PO TABS
100.0000 mg | ORAL_TABLET | Freq: Every day | ORAL | Status: DC
  Administered 2019-06-01: 19:00:00 100 mg via ORAL
  Filled 2019-06-01: qty 1

## 2019-06-01 MED ORDER — LORAZEPAM 1 MG PO TABS: 0.0000 mg | ORAL_TABLET | Freq: Two times a day (BID) | ORAL | Status: DC

## 2019-06-01 MED ORDER — THIAMINE HCL 100 MG/ML IJ SOLN: 100.0000 mg | Freq: Every day | INTRAMUSCULAR | Status: DC

## 2019-06-01 MED ORDER — LORAZEPAM 1 MG PO TABS
0.0000 mg | ORAL_TABLET | Freq: Four times a day (QID) | ORAL | Status: DC
  Administered 2019-06-01: 1 mg via ORAL
  Filled 2019-06-01: qty 1

## 2019-06-01 MED ORDER — LORAZEPAM 2 MG/ML IJ SOLN: 0.0000 mg | Freq: Four times a day (QID) | INTRAMUSCULAR | Status: DC

## 2019-06-01 MED ORDER — IBUPROFEN 400 MG PO TABS
600.0000 mg | ORAL_TABLET | Freq: Once | ORAL | Status: AC
  Administered 2019-06-01: 600 mg via ORAL
  Filled 2019-06-01: qty 1

## 2019-06-01 NOTE — ED Notes (Signed)
Pt stated that he only wanted to be away for two days. Pt is concerned about losing his job, making his child support payments, his living arrangements, and his children. Pt stated that if he misses more than two days that he may lose his job and it will affect all of the above. Pt does not want to be medicated and that he wanted to be set up to talk to a counselor.

## 2019-06-01 NOTE — ED Notes (Signed)
Pt states he feels as if something is in his L. Eye; this RN assessed his eye with no remarkable findings; eye flushed per pts request.

## 2019-06-01 NOTE — ED Notes (Signed)
PT speaking with TTS 
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
## 2019-06-01 NOTE — [facility] Counselor (Signed)
Disposition   Tanika Lewis, NP, recommend inpatient treatment  

## 2019-06-01 NOTE — Progress Notes (Signed)
Patient meets criteria for inpatient treatment per Hillery Jacks, NP. No appropriate beds at [facility] currently. CSW faxed referrals to the following facilities for review:  [facility]  [facility]   [facility]  [facility]  [facility]  [facility]  [facility]   [facility]  [facility]   [facility]  [facility]   TTS will continue to seek bed placement.     Ruthann Cancer MSW, [facility] Clincal Social Worker Disposition  [facility] Ph: [phone] Fax: [phone] 06/01/2019 4:38 PM

## 2019-06-01 NOTE — ED Notes (Addendum)
Pt's belongings placed in locker #12 (1 bag). Valuables Envelope # [phone] given to security. Pt still has his phone.
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
## 2019-06-01 NOTE — ED Notes (Signed)
Lunch tray ordered 

## 2019-06-01 NOTE — ED Notes (Signed)
Pt instructed to provide Urine Sample; pt forgot and accidentally dumped sample in toilet. Will reassess ability to provide sample later.

## 2019-06-01 NOTE — ED Notes (Signed)
Pt wanded by security; no items found besides cellphone and charger.

## 2019-06-02 ENCOUNTER — Telehealth: Payer: Self-pay | Admitting: Pharmacy Technician

## 2019-06-02 NOTE — Telephone Encounter (Signed)
[facility] Patient Advocate Encounter  Received a fax from [facility] stating they reached out to the patient to complete his application and see if he wanted the mail order option. I called the patient and spoke to him about [employer]. He has been UMAP approved and will receive his medications at [facility] instead. He has currently picked up 2 refills but has not started the medication. He plans to start 03/23 when he does not have his children.

## 2019-06-02 NOTE — ED Notes (Signed)
Belongings returned to pt. Discharge instructions reviewed. Pt verbalized understanding.
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
## 2019-06-02 NOTE — ED Notes (Signed)
Pt's cell phone never inventoried during previous shifts, in pt's possession at bedside. Pt upset and arguing with staff, not wanting to give phone to RN to store in security office. Pt eventually agreed to give RN phone and signed inventory sheet. When NT attempted to take phone to security office, pt called RN back to room and stated he no longer wanted to stay on voluntary basis and wished to leave. Notified PA and charge RN.

## 2019-06-02 NOTE — ED Notes (Signed)
This RN called the staffing office for a sitter; no sitter at this time.

## 2019-06-10 ENCOUNTER — Telehealth: Payer: Self-pay

## 2019-06-10 NOTE — Telephone Encounter (Signed)
Patient called office today requesting information for [facility] and services they provide. Patient has not worked with them before and is interested in learning more about them today. Patient call transferred to Patience, Case Manager. Lorenso Courier, [place]

## 2019-07-01 ENCOUNTER — Other Ambulatory Visit: Payer: Self-pay

## 2019-07-01 DIAGNOSIS — Z113 Encounter for screening for infections with a predominantly sexual mode of transmission: Secondary | ICD-10-CM

## 2019-07-01 DIAGNOSIS — B20 Human immunodeficiency virus [HIV] disease: Secondary | ICD-10-CM

## 2019-07-01 DIAGNOSIS — Z79899 Other long term (current) drug therapy: Secondary | ICD-10-CM

## 2019-07-09 ENCOUNTER — Telehealth: Payer: Self-pay | Admitting: *Deleted

## 2019-07-09 ENCOUNTER — Other Ambulatory Visit: Payer: Self-pay

## 2019-07-09 NOTE — Telephone Encounter (Signed)
Yes, he can have lab work drawn when he comes in for his visit.

## 2019-07-09 NOTE — Telephone Encounter (Signed)
Patient is unable to come to labs today, asked if they could be drawn at his visit 5/4 with Dr Orvan Falconer.  He is doing well on the [place], stated he started it a few weeks late but has not missed a dose yet. He is picking up a refill this week. Andree Coss, RN

## 2019-07-14 ENCOUNTER — Telehealth: Payer: Self-pay

## 2019-07-14 NOTE — Telephone Encounter (Signed)
COVID-19 Pre-Screening Questions:07/14/19  Do you currently have a fever (>100 F), chills or unexplained body aches?NO  . Are you currently experiencing new cough, shortness of breath, sore throat, runny nose? NO .  Have you recently travelled outside the state of [place] in the last 14 days?NO .  Have you been in contact with someone that is currently pending confirmation of Covid19 testing or has been confirmed to have the Covid19 virus? NO  **If the patient answers NO to ALL questions -  advise the patient to please call the clinic before coming to the office should any symptoms develop.     

## 2019-07-15 ENCOUNTER — Ambulatory Visit (INDEPENDENT_AMBULATORY_CARE_PROVIDER_SITE_OTHER): Payer: Self-pay | Admitting: Internal Medicine

## 2019-07-15 ENCOUNTER — Other Ambulatory Visit: Payer: Self-pay

## 2019-07-15 ENCOUNTER — Encounter: Payer: Self-pay | Admitting: Internal Medicine

## 2019-07-15 DIAGNOSIS — B20 Human immunodeficiency virus [HIV] disease: Secondary | ICD-10-CM

## 2019-07-15 DIAGNOSIS — K089 Disorder of teeth and supporting structures, unspecified: Secondary | ICD-10-CM

## 2019-07-15 DIAGNOSIS — F32A Depression, unspecified: Secondary | ICD-10-CM | POA: Insufficient documentation

## 2019-07-15 DIAGNOSIS — F17210 Nicotine dependence, cigarettes, uncomplicated: Secondary | ICD-10-CM

## 2019-07-15 DIAGNOSIS — F321 Major depressive disorder, single episode, moderate: Secondary | ICD-10-CM

## 2019-07-15 DIAGNOSIS — F1490 Cocaine use, unspecified, uncomplicated: Secondary | ICD-10-CM

## 2019-07-15 DIAGNOSIS — F329 Major depressive disorder, single episode, unspecified: Secondary | ICD-10-CM | POA: Insufficient documentation

## 2019-07-15 NOTE — Assessment & Plan Note (Signed)
I encouraged him to go through with his plan to quit using cocaine.

## 2019-07-15 NOTE — Assessment & Plan Note (Signed)
His adherence with Susanne Borders is very good.  He will get repeat blood work today and follow-up in 3 months.

## 2019-07-15 NOTE — Assessment & Plan Note (Signed)
I talked to him again about the importance of long-term cigarette cessation.
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
## 2019-07-15 NOTE — Assessment & Plan Note (Signed)
He was introduced to our behavioral health counselor today and an appointment will be made for initial assessment.

## 2019-07-15 NOTE — Assessment & Plan Note (Signed)
Dental referral placed today for [facility]. Information to schedule appointment completed today.   

## 2019-07-16 LAB — T-HELPER CELL (CD4) - (RCID CLINIC ONLY)
CD4 % Helper T Cell: 29 % — ABNORMAL LOW (ref 33–65)
CD4 T Cell Abs: 478 /uL (ref 400–1790)

## 2019-07-18 LAB — HIV-1 RNA QUANT-NO REFLEX-BLD
HIV 1 RNA Quant: 32 copies/mL — ABNORMAL HIGH
HIV-1 RNA Quant, Log: 1.51 Log copies/mL — ABNORMAL HIGH

## 2019-09-10 ENCOUNTER — Telehealth: Payer: Self-pay

## 2019-09-10 NOTE — Telephone Encounter (Signed)
Patient called today complaining of small red bumps. States they are all over his body. They are not itch, but states they are swollen. Issues has only been happening for a couple of days. Has not made any changes in soap or laundry detergent. Advised patient to contact his PCP or go to urgent care for evaluation if issues gets worse.  Lorenso Courier, [place]

## 2019-09-10 NOTE — Telephone Encounter (Signed)
Attempted to call patient to follow up on his concern for rash. Will need to schedule appointment if rash gets worse. Unable to reach patient at this time. Left voicemail requesting call back. Lorenso Courier, [place]

## 2019-09-10 NOTE — Telephone Encounter (Signed)
Please ask him to let us know if the rash is getting worse and we will try to get him a work in visit.

## 2019-09-19 ENCOUNTER — Emergency Department (HOSPITAL_COMMUNITY)
Admission: EM | Admit: 2019-09-19 | Discharge: 2019-09-20 | Disposition: A | Discharge status: Home / Self Care | Payer: Self-pay | Attending: Emergency Medicine | Admitting: Emergency Medicine

## 2019-09-19 ENCOUNTER — Encounter (HOSPITAL_COMMUNITY): Payer: Self-pay

## 2019-09-19 ENCOUNTER — Emergency Department (HOSPITAL_COMMUNITY): Payer: Self-pay

## 2019-09-19 DIAGNOSIS — Y929 Unspecified place or not applicable: Secondary | ICD-10-CM | POA: Insufficient documentation

## 2019-09-19 DIAGNOSIS — F17210 Nicotine dependence, cigarettes, uncomplicated: Secondary | ICD-10-CM | POA: Insufficient documentation

## 2019-09-19 DIAGNOSIS — Z79899 Other long term (current) drug therapy: Secondary | ICD-10-CM | POA: Insufficient documentation

## 2019-09-19 DIAGNOSIS — Z21 Asymptomatic human immunodeficiency virus [HIV] infection status: Secondary | ICD-10-CM | POA: Insufficient documentation

## 2019-09-19 DIAGNOSIS — Y999 Unspecified external cause status: Secondary | ICD-10-CM | POA: Insufficient documentation

## 2019-09-19 DIAGNOSIS — W3400XA Accidental discharge from unspecified firearms or gun, initial encounter: Secondary | ICD-10-CM

## 2019-09-19 DIAGNOSIS — Y939 Activity, unspecified: Secondary | ICD-10-CM | POA: Insufficient documentation

## 2019-09-19 DIAGNOSIS — Z23 Encounter for immunization: Secondary | ICD-10-CM | POA: Insufficient documentation

## 2019-09-19 DIAGNOSIS — S92251B Displaced fracture of navicular [scaphoid] of right foot, initial encounter for open fracture: Secondary | ICD-10-CM | POA: Insufficient documentation

## 2019-09-19 HISTORY — DX: Human immunodeficiency virus (HIV) disease: B20

## 2019-09-19 HISTORY — DX: Asymptomatic human immunodeficiency virus (hiv) infection status: Z21

## 2019-09-19 LAB — CBC
HCT: 39.4 % (ref 39.0–52.0)
Hemoglobin: 13.4 g/dL (ref 13.0–17.0)
MCH: 31.8 pg (ref 26.0–34.0)
MCHC: 34 g/dL (ref 30.0–36.0)
MCV: 93.6 fL (ref 80.0–100.0)
Platelets: 308 10*3/uL (ref 150–400)
RBC: 4.21 MIL/uL — ABNORMAL LOW (ref 4.22–5.81)
RDW: 13.2 % (ref 11.5–15.5)
WBC: 7.5 10*3/uL (ref 4.0–10.5)
nRBC: 0 % (ref 0.0–0.2)

## 2019-09-19 LAB — BASIC METABOLIC PANEL
Anion gap: 12 (ref 5–15)
BUN: 8 mg/dL (ref 6–20)
CO2: 17 mmol/L — ABNORMAL LOW (ref 22–32)
Calcium: 9.2 mg/dL (ref 8.9–10.3)
Chloride: 110 mmol/L (ref 98–111)
Creatinine, Ser: 1.16 mg/dL (ref 0.61–1.24)
GFR calc Af Amer: >60 mL/min (ref >60)
GFR calc non Af Amer: >60 mL/min (ref >60)
Glucose, Bld: 102 mg/dL — ABNORMAL HIGH (ref 70–99)
Potassium: 3.2 mmol/L — ABNORMAL LOW (ref 3.5–5.1)
Sodium: 139 mmol/L (ref 135–145)

## 2019-09-19 MED ORDER — CEFAZOLIN SODIUM-DEXTROSE 1-4 GM/50ML-% IV SOLN: 1.0000 g | Freq: Once | INTRAVENOUS | Status: DC

## 2019-09-19 MED ORDER — TETANUS-DIPHTH-ACELL PERTUSSIS 5-2.5-18.5 LF-MCG/0.5 IM SUSP
0.5000 mL | Freq: Once | INTRAMUSCULAR | Status: AC
  Administered 2019-09-20: 0.5 mL via INTRAMUSCULAR
  Filled 2019-09-19: qty 0.5

## 2019-09-19 MED ORDER — CEFAZOLIN SODIUM 1 G IJ SOLR
1.0000 g | Freq: Once | INTRAMUSCULAR | Status: AC
  Administered 2019-09-20: 1 g via INTRAMUSCULAR
  Filled 2019-09-19 (×2): qty 10
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
## 2019-09-19 NOTE — ED Notes (Signed)
RN called pharmacy for medication for pt.

## 2019-09-19 NOTE — ED Notes (Signed)
Pt standing at door w/ blood all over the floor stating we needed to hurry up b/c "I have to leave."  RN explained the delay and directed back to bed.  Pt complied.

## 2019-09-19 NOTE — ED Notes (Addendum)
Provider and RN updated pt current situation. Provider informed RN to hold on COVID test
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
## 2019-09-19 NOTE — ED Notes (Signed)
Portable xray @bedside
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
## 2019-09-20 NOTE — Progress Notes (Signed)
Orthopedic Tech Progress Note Patient Details:  Roberto Parker 1985/12/13 [id number]  Ortho Devices Type of Ortho Device: CAM walker Ortho Device/Splint Location: RLE Ortho Device/Splint Interventions: Ordered, Adjustment   Post Interventions Patient Tolerated: Well Instructions Provided: Care of device, Adjustment of device, Poper ambulation with device   Hazelgrace Bonham 09/20/2019, 12:11 AM

## 2019-10-01 ENCOUNTER — Emergency Department (HOSPITAL_COMMUNITY): Admission: EM | Admit: 2019-10-01 | Discharge: 2019-10-01 | Discharge status: Against Medical Advice | Payer: Self-pay

## 2019-10-01 ENCOUNTER — Other Ambulatory Visit: Payer: Self-pay

## 2019-10-01 ENCOUNTER — Ambulatory Visit (INDEPENDENT_AMBULATORY_CARE_PROVIDER_SITE_OTHER): Payer: Self-pay

## 2019-10-01 ENCOUNTER — Encounter (HOSPITAL_COMMUNITY): Payer: Self-pay | Admitting: Emergency Medicine

## 2019-10-01 ENCOUNTER — Ambulatory Visit (HOSPITAL_COMMUNITY)
Admission: EM | Admit: 2019-10-01 | Discharge: 2019-10-01 | Disposition: A | Discharge status: Home / Self Care | Payer: Self-pay | Attending: Family Medicine | Admitting: Family Medicine

## 2019-10-01 DIAGNOSIS — M542 Cervicalgia: Secondary | ICD-10-CM

## 2019-10-01 DIAGNOSIS — S40012A Contusion of left shoulder, initial encounter: Secondary | ICD-10-CM

## 2019-10-01 DIAGNOSIS — S161XXA Strain of muscle, fascia and tendon at neck level, initial encounter: Secondary | ICD-10-CM

## 2019-10-01 DIAGNOSIS — S46912A Strain of unspecified muscle, fascia and tendon at shoulder and upper arm level, left arm, initial encounter: Secondary | ICD-10-CM

## 2019-10-01 DIAGNOSIS — M25512 Pain in left shoulder: Secondary | ICD-10-CM

## 2019-10-01 MED ORDER — IBUPROFEN 800 MG PO TABS
800.0000 mg | ORAL_TABLET | Freq: Once | ORAL | Status: AC
  Administered 2019-10-01: 800 mg via ORAL

## 2019-10-01 MED ORDER — NAPROXEN 375 MG PO TABS
375.0000 mg | ORAL_TABLET | Freq: Two times a day (BID) | ORAL | 0 refills | Status: DC
Start: 2019-10-01 — End: 2020-01-08

## 2019-10-01 MED ORDER — IBUPROFEN 800 MG PO TABS
ORAL_TABLET | ORAL | Status: AC
  Filled 2019-10-01: qty 1

## 2019-10-01 MED ORDER — CYCLOBENZAPRINE HCL 5 MG PO TABS
5.0000 mg | ORAL_TABLET | Freq: Every day | ORAL | 0 refills | Status: DC
Start: 2019-10-01 — End: 2020-01-08
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
## 2019-10-01 NOTE — ED Notes (Signed)
I called patient back to triage and no one responded

## 2019-10-01 NOTE — ED Notes (Signed)
Pt not visible in lobby, bathroom, or parking lot.
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
## 2019-10-01 NOTE — ED Notes (Signed)
Pt does not respond when called to triage.
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
## 2019-10-10 ENCOUNTER — Other Ambulatory Visit: Payer: Self-pay | Admitting: Internal Medicine

## 2019-10-10 ENCOUNTER — Telehealth: Payer: Self-pay

## 2019-10-10 DIAGNOSIS — B20 Human immunodeficiency virus [HIV] disease: Secondary | ICD-10-CM

## 2019-10-10 MED ORDER — BICTEGRAVIR-EMTRICITAB-TENOFOV 50-200-25 MG PO TABS: 1.0000 | ORAL_TABLET | Freq: Every day | ORAL | 0 refills | Status: DC

## 2019-10-10 MED ORDER — BICTEGRAVIR-EMTRICITAB-TENOFOV 50-200-25 MG PO TABS: 1.0000 | ORAL_TABLET | Freq: Every day | ORAL | 5 refills | Status: DC

## 2019-10-10 NOTE — Telephone Encounter (Signed)
I sent in 6 refills.

## 2019-10-10 NOTE — Telephone Encounter (Signed)
Patient called office today to follow up on missed call from office. Patient has an appointment with provider on 8/4. Has moved to [facility] and is not sure how long he will be there. Would like to reschedule appointment until later in August when he has court in [place]. Front desk will reschedule. Advised patient if he plans on moving to [place] permanently he will need to transfer care to local provider. Patient understands this, but would like to continue getting refill by office until he is seen by new provider. Will have patient reschedule appointment until later date. Will forward message to MD to advise if office will send additional refills.  Has not located new provider yet.  Roberto Parker, [place]

## 2019-10-15 ENCOUNTER — Ambulatory Visit: Payer: Self-pay | Admitting: Internal Medicine

## 2019-10-16 ENCOUNTER — Inpatient Hospital Stay: Payer: Self-pay | Admitting: Physician Assistant

## 2019-11-03 ENCOUNTER — Inpatient Hospital Stay: Payer: Self-pay | Admitting: Physician Assistant

## 2019-11-04 ENCOUNTER — Ambulatory Visit (INDEPENDENT_AMBULATORY_CARE_PROVIDER_SITE_OTHER): Payer: Self-pay | Admitting: Internal Medicine

## 2019-11-04 ENCOUNTER — Other Ambulatory Visit: Payer: Self-pay

## 2019-11-04 ENCOUNTER — Encounter: Payer: Self-pay | Admitting: Internal Medicine

## 2019-11-04 DIAGNOSIS — B20 Human immunodeficiency virus [HIV] disease: Secondary | ICD-10-CM

## 2019-11-04 DIAGNOSIS — F17210 Nicotine dependence, cigarettes, uncomplicated: Secondary | ICD-10-CM

## 2019-11-04 DIAGNOSIS — F3340 Major depressive disorder, recurrent, in remission, unspecified: Secondary | ICD-10-CM
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
## 2019-11-04 NOTE — Assessment & Plan Note (Signed)
His infection has come under much better control since starting Biktarvy several months ago.  He will get repeat blood work today and follow-up in 2 months.

## 2019-11-04 NOTE — Assessment & Plan Note (Signed)
I encouraged him to work on quitting again.

## 2019-11-04 NOTE — Assessment & Plan Note (Signed)
His depression is in remission. 

## 2019-11-05 ENCOUNTER — Encounter: Payer: Self-pay | Admitting: Internal Medicine

## 2019-11-05 LAB — T-HELPER CELL (CD4) - (RCID CLINIC ONLY)
CD4 % Helper T Cell: 33 % (ref 33–65)
CD4 T Cell Abs: 713 /uL (ref 400–1790)

## 2019-11-06 LAB — HIV-1 RNA QUANT-NO REFLEX-BLD
HIV 1 RNA Quant: 26 Copies/mL — ABNORMAL HIGH
HIV-1 RNA Quant, Log: 1.42 Log cps/mL — ABNORMAL HIGH

## 2019-11-14 ENCOUNTER — Other Ambulatory Visit: Payer: Self-pay

## 2019-11-14 ENCOUNTER — Ambulatory Visit: Payer: Self-pay | Admitting: Nurse Practitioner

## 2019-11-14 DIAGNOSIS — B20 Human immunodeficiency virus [HIV] disease: Secondary | ICD-10-CM

## 2019-12-30 ENCOUNTER — Encounter: Payer: Self-pay | Admitting: Internal Medicine

## 2019-12-30 ENCOUNTER — Other Ambulatory Visit: Payer: Self-pay

## 2019-12-30 ENCOUNTER — Telehealth (INDEPENDENT_AMBULATORY_CARE_PROVIDER_SITE_OTHER): Payer: Self-pay | Admitting: Internal Medicine

## 2019-12-30 ENCOUNTER — Telehealth: Payer: Self-pay

## 2019-12-30 NOTE — Telephone Encounter (Signed)
RN spoke with patient and completed pre-visit screenings and medication reconciliation. Dr. Orvan Falconer attempted to call patient for his appointment and was unable to reach the patient. Unable to complete today's appointment.   [name], RN

## 2020-01-08 ENCOUNTER — Encounter (HOSPITAL_COMMUNITY): Payer: Self-pay

## 2020-01-08 ENCOUNTER — Ambulatory Visit (HOSPITAL_COMMUNITY)
Admission: EM | Admit: 2020-01-08 | Discharge: 2020-01-08 | Disposition: A | Discharge status: Home / Self Care | Payer: Self-pay | Attending: Family Medicine | Admitting: Family Medicine

## 2020-01-08 ENCOUNTER — Other Ambulatory Visit: Payer: Self-pay

## 2020-01-08 DIAGNOSIS — L089 Local infection of the skin and subcutaneous tissue, unspecified: Secondary | ICD-10-CM

## 2020-01-08 MED ORDER — VALACYCLOVIR HCL 1 G PO TABS
1000.0000 mg | ORAL_TABLET | Freq: Three times a day (TID) | ORAL | 0 refills | Status: DC
Start: 2020-01-08 — End: 2023-03-27

## 2020-01-08 MED ORDER — DOXYCYCLINE HYCLATE 100 MG PO CAPS
100.0000 mg | ORAL_CAPSULE | Freq: Two times a day (BID) | ORAL | 0 refills | Status: AC
Start: 2020-01-08 — End: 2020-01-15

## 2020-01-08 NOTE — ED Triage Notes (Signed)
Pt is here with bilateral ring finger(finger #4) infection that started 2 weeks ago, pt started stabbing at it 10 days ago. Pt has not taken any meds to relieve discomfort.

## 2020-01-08 NOTE — Discharge Instructions (Addendum)
Treating you for bacterial and viral infection Medicines as prescribed Do not pick at the area.  Keep clean and dry Wear gloves at work.  Follow up as needed for continued or worsening symptoms
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
## 2020-03-22 ENCOUNTER — Other Ambulatory Visit: Payer: Self-pay | Admitting: Internal Medicine

## 2020-03-22 DIAGNOSIS — B20 Human immunodeficiency virus [HIV] disease: Secondary | ICD-10-CM

## 2020-04-23 ENCOUNTER — Other Ambulatory Visit: Payer: Self-pay | Admitting: Internal Medicine

## 2020-04-23 ENCOUNTER — Telehealth: Payer: Self-pay

## 2020-04-23 DIAGNOSIS — B20 Human immunodeficiency virus [HIV] disease: Secondary | ICD-10-CM

## 2020-04-23 NOTE — Telephone Encounter (Signed)
Received refill request for patient's biktarvy. Patient is overdue for a visit with Dr. Orvan Falconer. Called patient to schedule appointment but he didn't answer. Unable to leave VM.  Joanne Brander Loyola Mast, RN

## 2020-04-30 ENCOUNTER — Telehealth: Payer: Self-pay

## 2020-04-30 DIAGNOSIS — B20 Human immunodeficiency virus [HIV] disease: Secondary | ICD-10-CM

## 2020-04-30 MED ORDER — BIKTARVY 50-200-25 MG PO TABS: 1.0000 | ORAL_TABLET | Freq: Every day | ORAL | 1 refills | Status: DC

## 2020-04-30 NOTE — Telephone Encounter (Signed)
Received call from patient requesting refill for Biktarvy. RN reminded him he is overdue for an appointment, patient states he has been going through a lot lately with unemployment and "moving around." He says he has only missed one pill since his last visit. Scheduled patient for 06/10/20 with Dr. Orvan Falconer, advised patient we will send in a refill but that he needs to keep appointment for future refills. He states the end of March is the earliest he can come in. 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 Connected patient to [facility] financial advisor to discuss possible ADAP renewal.   [name], RN

## 2020-05-03 ENCOUNTER — Encounter: Payer: Self-pay | Admitting: Internal Medicine

## 2020-05-11 ENCOUNTER — Telehealth: Payer: Self-pay

## 2020-05-18 ENCOUNTER — Telehealth: Payer: Self-pay | Admitting: *Deleted

## 2020-05-18 DIAGNOSIS — B20 Human immunodeficiency virus [HIV] disease: Secondary | ICD-10-CM

## 2020-05-18 DIAGNOSIS — Z113 Encounter for screening for infections with a predominantly sexual mode of transmission: Secondary | ICD-10-CM

## 2020-05-18 DIAGNOSIS — Z79899 Other long term (current) drug therapy: Secondary | ICD-10-CM

## 2020-05-18 MED ORDER — BIKTARVY 50-200-25 MG PO TABS: 1.0000 | ORAL_TABLET | Freq: Every day | ORAL | 0 refills | Status: DC

## 2020-05-18 NOTE — Telephone Encounter (Signed)
Patient called. He is currently out of town for a few months. He will be in [place] on 4/13, asked if he could change his appointment to that day. Dr Orvan Falconer is unavailable on 4/13, but patient would like to get labs that day. RN scheduled lab appointment 4/13, with virtual follow up via mychart video visit on 4/27.  Patient agreeable. He is not out of medication at this time, has renewed RW/ADAP (current through 11/2020). Lab orders placed. Andree Coss, RN

## 2020-05-18 NOTE — Telephone Encounter (Signed)
error 

## 2020-05-31 ENCOUNTER — Other Ambulatory Visit: Payer: Self-pay

## 2020-05-31 DIAGNOSIS — B20 Human immunodeficiency virus [HIV] disease: Secondary | ICD-10-CM

## 2020-05-31 MED ORDER — BIKTARVY 50-200-25 MG PO TABS: 1.0000 | ORAL_TABLET | Freq: Every day | ORAL | 0 refills | Status: DC

## 2020-06-01 ENCOUNTER — Other Ambulatory Visit: Payer: Self-pay

## 2020-06-01 DIAGNOSIS — B20 Human immunodeficiency virus [HIV] disease: Secondary | ICD-10-CM

## 2020-06-10 ENCOUNTER — Ambulatory Visit: Payer: Self-pay | Admitting: Internal Medicine

## 2020-06-23 ENCOUNTER — Other Ambulatory Visit: Payer: Self-pay

## 2020-06-24 ENCOUNTER — Other Ambulatory Visit: Payer: Self-pay

## 2020-06-24 DIAGNOSIS — B20 Human immunodeficiency virus [HIV] disease: Secondary | ICD-10-CM

## 2020-06-24 DIAGNOSIS — Z113 Encounter for screening for infections with a predominantly sexual mode of transmission: Secondary | ICD-10-CM

## 2020-06-24 DIAGNOSIS — Z79899 Other long term (current) drug therapy: Secondary | ICD-10-CM

## 2020-06-25 LAB — T-HELPER CELL (CD4) - (RCID CLINIC ONLY)
CD4 % Helper T Cell: 39 % (ref 33–65)
CD4 T Cell Abs: 1027 /uL (ref 400–1790)

## 2020-06-28 ENCOUNTER — Other Ambulatory Visit: Payer: Self-pay | Admitting: Internal Medicine

## 2020-06-28 DIAGNOSIS — B20 Human immunodeficiency virus [HIV] disease: Secondary | ICD-10-CM

## 2020-06-28 LAB — COMPLETE METABOLIC PANEL WITH GFR
AG Ratio: 1.4 (calc) (ref 1.0–2.5)
ALT: 44 U/L (ref 9–46)
AST: 39 U/L (ref 10–40)
Albumin: 4.1 g/dL (ref 3.6–5.1)
Alkaline phosphatase (APISO): 58 U/L (ref 36–130)
BUN: 11 mg/dL (ref 7–25)
CO2: 26 mmol/L (ref 20–32)
Calcium: 8.9 mg/dL (ref 8.6–10.3)
Chloride: 106 mmol/L (ref 98–110)
Creat: 1.13 mg/dL (ref 0.60–1.35)
GFR, Est African American: 97 mL/min/{1.73_m2} (ref >=60)
GFR, Est Non African American: 84 mL/min/{1.73_m2} (ref >=60)
Globulin: 2.9 g/dL (calc) (ref 1.9–3.7)
Glucose, Bld: 79 mg/dL (ref 65–99)
Potassium: 3.7 mmol/L (ref 3.5–5.3)
Sodium: 141 mmol/L (ref 135–146)
Total Bilirubin: 0.5 mg/dL (ref 0.2–1.2)
Total Protein: 7 g/dL (ref 6.1–8.1)

## 2020-06-28 LAB — LIPID PANEL
Cholesterol: 174 mg/dL (ref <200)
HDL: 44 mg/dL (ref >=40)
LDL Cholesterol (Calc): 95 mg/dL (calc)
Non-HDL Cholesterol (Calc): 130 mg/dL (calc) — ABNORMAL HIGH (ref <130)
Total CHOL/HDL Ratio: 4 (calc) (ref <5.0)
Triglycerides: 232 mg/dL — ABNORMAL HIGH (ref <150)

## 2020-06-28 LAB — CBC WITH DIFFERENTIAL/PLATELET
Absolute Monocytes: 632 cells/uL (ref 200–950)
Basophils Absolute: 24 cells/uL (ref 0–200)
Basophils Relative: 0.3 %
Eosinophils Absolute: 71 cells/uL (ref 15–500)
Eosinophils Relative: 0.9 %
HCT: 40.5 % (ref 38.5–50.0)
Hemoglobin: 13.8 g/dL (ref 13.2–17.1)
Lymphs Abs: 2733 cells/uL (ref 850–3900)
MCH: 32.8 pg (ref 27.0–33.0)
MCHC: 34.1 g/dL (ref 32.0–36.0)
MCV: 96.2 fL (ref 80.0–100.0)
MPV: 10.4 fL (ref 7.5–12.5)
Monocytes Relative: 8 %
Neutro Abs: 4440 cells/uL (ref 1500–7800)
Neutrophils Relative %: 56.2 %
Platelets: 294 10*3/uL (ref 140–400)
RBC: 4.21 10*6/uL (ref 4.20–5.80)
RDW: 13.3 % (ref 11.0–15.0)
Total Lymphocyte: 34.6 %
WBC: 7.9 10*3/uL (ref 3.8–10.8)

## 2020-06-28 LAB — HIV-1 RNA QUANT-NO REFLEX-BLD
HIV 1 RNA Quant: 21 Copies/mL — ABNORMAL HIGH
HIV-1 RNA Quant, Log: 1.32 Log cps/mL — ABNORMAL HIGH

## 2020-06-28 LAB — RPR TITER: RPR Titer: 1:2 {titer} — ABNORMAL HIGH

## 2020-06-28 LAB — RPR: RPR Ser Ql: REACTIVE — AB

## 2020-06-28 LAB — FLUORESCENT TREPONEMAL AB(FTA)-IGG-BLD: Fluorescent Treponemal ABS: REACTIVE — AB

## 2020-06-29 ENCOUNTER — Encounter: Payer: Self-pay | Admitting: Internal Medicine

## 2020-06-29 ENCOUNTER — Telehealth: Payer: Self-pay

## 2020-06-29 DIAGNOSIS — A530 Latent syphilis, unspecified as early or late: Secondary | ICD-10-CM | POA: Insufficient documentation

## 2020-06-29 NOTE — Telephone Encounter (Signed)
-----   Message from Cliffton Asters, MD sent at 06/29/2020  3:29 PM EDT ----- Regarding: RE: RPR Yes, he needs treatment for latent syphilis.  He will need benzathine penicillin 2,400,000 units IM weekly x3 doses.  Thanks.  Roberto Parker ----- Message ----- From: [name], RN Sent: 06/29/2020   3:13 PM EDT To: Cliffton Asters, MD Subject: RPR                                            Received notification from HD that patient's RPR titer was 1:2 on 06/24/20. Previously non-reactive on 05/07/19 - would you like to go ahead and treat? Patient has virtual appt with you on 4/27  Thanks! [name]

## 2020-06-29 NOTE — Telephone Encounter (Signed)
RN spoke with patient to relay positive Syphilis results per Dr. Orvan Falconer. Patient is confused as to how he contracted this, RN explained that syphilis is a sexually transmitted disease and that it can be treated. Patient confirms he has no known allergies, he does not remember if he's ever had penicillin before. He is unable to come in for treatment until May as he is currently in [place]. RN suggested that patient seek treatment at a local health department, he would rather wait and be treated here in May. RN confirmed that this is appropriate with Dr. Orvan Falconer.   Explained that treatment consists of 2 shots once a week for three weeks. Emphasized that he will need to be able to come into the office weekly for three weeks once he starts treatment, patient confirms he will be able to do this.   Advised patient no sex until treatment is completed plus an additional 10 days and instructed to notify sexual partners for testing and treatment. Patient verbalized understanding and has no further questions.    [name], RN

## 2020-07-01 NOTE — Telephone Encounter (Signed)
Patient called office regarding call from RN earlier this week. States that since he is not having symptoms would like to know if he can hold off on treatment. Advised patient to go ahead and get treated and not to wait until symptoms start. Advised he contact health department to get set up for treatment locally since he is not in [place].  Patient understands he must refrain from all sexual activity until he is completely treated and an additional 10 days. Will contact recent partners to get tested/treated at local health department.  Juanita Laster, RMA

## 2020-07-07 ENCOUNTER — Encounter: Payer: Self-pay | Admitting: Internal Medicine

## 2020-07-07 ENCOUNTER — Telehealth (INDEPENDENT_AMBULATORY_CARE_PROVIDER_SITE_OTHER): Payer: Self-pay | Admitting: Internal Medicine

## 2020-07-07 ENCOUNTER — Other Ambulatory Visit: Payer: Self-pay

## 2020-07-07 DIAGNOSIS — B20 Human immunodeficiency virus [HIV] disease: Secondary | ICD-10-CM

## 2020-07-07 MED ORDER — BIKTARVY 50-200-25 MG PO TABS: 1.0000 | ORAL_TABLET | Freq: Every day | ORAL | 11 refills | Status: DC

## 2020-07-07 NOTE — Progress Notes (Signed)
Virtual Visit via Video Note  I connected with Roberto Parker on 07/07/20 at 10:00 AM EDT by a video enabled telemedicine application and verified that I am speaking with the correct person using two identifiers.  Location: Patient: Home Provider: [facility]   I discussed the limitations of evaluation and management by telemedicine and the availability of in person appointments. The patient expressed understanding and agreed to proceed.  History of Present Illness: I conducted a video visit with the allergist today.  He is currently out of town looking for work.  Fortunately he has not had any problems obtaining, taking or tolerating his Biktarvy.  He takes it when he first gets up each morning.  He does not recall missing any doses.  When he came in for recent blood work it was discovered that he has a positive RPR suggesting latent syphilis acquired sometime the past 13 or 14 months.  He is scheduled to go to his local health department in 48 hours to start treatment with benzathine penicillin 2,400,000 units IM weekly x3 doses.  He said that he has been very upset and more anxious and depressed since his syphilis was diagnosed.  He started back smoking cigarettes recently and is no longer getting any regular exercise.  He is hoping to get back on track with healthier habits soon.   Observations/Objective: HIV 1 RNA Quant  Date Value  06/24/2020 21 Copies/mL (H)  11/04/2019 26 Copies/mL (H)  07/15/2019 32 copies/mL (H)   CD4 T Cell Abs (/uL)  Date Value  06/24/2020 1,027  11/04/2019 713  07/15/2019 478   RPR 05/07/2019 nonreactive RPR 06/24/2020 1:2 with positive treponemal antibody  Assessment and Plan: His HIV infection remains under excellent, long-term control.  He will continue Biktarvy and follow-up here in 6 months.  He called back after the visit and asked if it would be possible for him to father a child.  He said that his sister has offered to carry his baby after artificial  insemination.  He wanted to know what risk that would pose to her and whether or not he had to disclose his HIV status.  I told him that yes he would always need to disclose his status to any future partners.  He will start treatment for latent syphilis at the health department in 48 hours.  He is still struggling with anxiety and depression.  He feels like if he gets back to having regular exercise and stopping smoking again he will feel better.    Follow Up Instructions: Continue Biktarvy and follow-up here in 6 months Treatment for latent syphilis   I discussed the assessment and treatment plan with the patient. The patient was provided an opportunity to ask questions and all were answered. The patient agreed with the plan and demonstrated an understanding of the instructions.   The patient was advised to call back or seek an in-person evaluation if the symptoms worsen or if the condition fails to improve as anticipated.  I provided 17 minutes of non-face-to-face time during this encounter.   Cliffton Asters, MD

## 2020-07-09 ENCOUNTER — Telehealth: Payer: Self-pay

## 2020-07-09 NOTE — Telephone Encounter (Signed)
Received call from Health Department regarding patient insurance coverage and income. Health department is requesting this information before they can treat patient for syphillis. Informed case manager that patient is uninsured and is only getting financial assistance for our office. Not able to answer what patient's income is. Advised they speak with patient for that information, Ended call after answering all questions. Roberto Parker

## 2020-07-21 NOTE — Telephone Encounter (Signed)
Received call from Health Department to confirm patient appointment for syphilis treatment. Advised health department of patient's upcoming appointment and next routine HIV follow up as well. Patient is currently scheduled for first Bicillin injection on 07/26/20, LPN will also place a note on schedule that patient will need 2  Additional weekly appointment scheduled for full treatment.   Roberto Parker

## 2020-07-26 ENCOUNTER — Ambulatory Visit: Payer: Self-pay

## 2020-08-24 ENCOUNTER — Other Ambulatory Visit: Payer: Self-pay | Admitting: Infectious Diseases

## 2020-08-24 DIAGNOSIS — B20 Human immunodeficiency virus [HIV] disease: Secondary | ICD-10-CM
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
## 2020-12-16 ENCOUNTER — Other Ambulatory Visit: Payer: Self-pay | Admitting: Internal Medicine

## 2020-12-16 ENCOUNTER — Telehealth: Payer: Self-pay

## 2020-12-16 ENCOUNTER — Ambulatory Visit: Payer: Self-pay

## 2020-12-16 ENCOUNTER — Other Ambulatory Visit (HOSPITAL_COMMUNITY): Payer: Self-pay

## 2020-12-16 ENCOUNTER — Other Ambulatory Visit: Payer: Self-pay

## 2020-12-16 DIAGNOSIS — B20 Human immunodeficiency virus [HIV] disease: Secondary | ICD-10-CM

## 2020-12-16 NOTE — Telephone Encounter (Signed)
Patient called, unable to get medication due to ADAP expiring. RN emphasized importance of renewing every January and July. He accepts appointment for phone visit with financial later this afternoon.   [name], RN

## 2020-12-22 ENCOUNTER — Encounter: Payer: Self-pay | Admitting: Internal Medicine

## 2020-12-30 ENCOUNTER — Other Ambulatory Visit: Payer: Self-pay | Admitting: Pharmacist

## 2020-12-30 DIAGNOSIS — B20 Human immunodeficiency virus [HIV] disease: Secondary | ICD-10-CM

## 2020-12-30 MED ORDER — BIKTARVY 50-200-25 MG PO TABS: 1.0000 | ORAL_TABLET | Freq: Every day | ORAL | 0 refills | Status: AC

## 2020-12-30 NOTE — Progress Notes (Signed)
Medication Samples have been provided to the patient.  Drug name: Biktarvy        Strength: 50/200/25 mg       Qty: 21 tablets (3 bottles)   LOT: [id number]   Exp.Date: 12/12/2022  Dosing instructions: Take one tablet by mouth once daily  The patient has been instructed regarding the correct time, dose, and frequency of taking this medication, including desired effects and most common side effects.   Teniyah Seivert L. Porter Nakama, PharmD, BCIDP, AAHIVP, CPP Clinical Pharmacist Practitioner Infectious Diseases Clinical Pharmacist [facility] 02/23/2020, 10:07 AM  

## 2021-01-06 ENCOUNTER — Ambulatory Visit: Payer: Self-pay | Admitting: Internal Medicine

## 2021-01-11 ENCOUNTER — Other Ambulatory Visit: Payer: Self-pay

## 2021-01-11 ENCOUNTER — Encounter: Payer: Self-pay | Admitting: Internal Medicine

## 2021-01-11 ENCOUNTER — Ambulatory Visit (INDEPENDENT_AMBULATORY_CARE_PROVIDER_SITE_OTHER): Payer: Self-pay | Admitting: Internal Medicine

## 2021-01-11 DIAGNOSIS — F17210 Nicotine dependence, cigarettes, uncomplicated: Secondary | ICD-10-CM

## 2021-01-11 DIAGNOSIS — A530 Latent syphilis, unspecified as early or late: Secondary | ICD-10-CM

## 2021-01-11 DIAGNOSIS — R635 Abnormal weight gain: Secondary | ICD-10-CM

## 2021-01-11 DIAGNOSIS — B20 Human immunodeficiency virus [HIV] disease: Secondary | ICD-10-CM

## 2021-01-11 DIAGNOSIS — F330 Major depressive disorder, recurrent, mild: Secondary | ICD-10-CM

## 2021-01-11 NOTE — Assessment & Plan Note (Signed)
I told him why it is potentially dangerous to take less than a full dose of his Biktarvy.  He will get repeat blood work today and follow-up in 6 months.

## 2021-01-11 NOTE — Assessment & Plan Note (Signed)
I encouraged him and make his best effort to quit again.

## 2021-01-11 NOTE — Assessment & Plan Note (Signed)
We will repeat his RPR today.

## 2021-01-11 NOTE — Assessment & Plan Note (Signed)
He has mild depression with anxiety.
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
## 2021-01-11 NOTE — Assessment & Plan Note (Signed)
I reviewed his current weight gain and BMI.  He says that he used to get regular exercise.  I encouraged him to start that again.

## 2021-01-12 LAB — T-HELPER CELL (CD4) - (RCID CLINIC ONLY)
CD4 % Helper T Cell: 46 % (ref 33–65)
CD4 T Cell Abs: 926 /uL (ref 400–1790)

## 2021-01-13 LAB — FLUORESCENT TREPONEMAL AB(FTA)-IGG-BLD: Fluorescent Treponemal ABS: REACTIVE — AB

## 2021-01-13 LAB — HIV-1 RNA QUANT-NO REFLEX-BLD
HIV 1 RNA Quant: 32 Copies/mL — ABNORMAL HIGH
HIV-1 RNA Quant, Log: 1.51 Log cps/mL — ABNORMAL HIGH

## 2021-01-13 LAB — RPR TITER: RPR Titer: 1:1 {titer} — ABNORMAL HIGH

## 2021-01-13 LAB — RPR: RPR Ser Ql: REACTIVE — AB

## 2021-05-13 ENCOUNTER — Other Ambulatory Visit: Payer: Self-pay

## 2021-05-18 ENCOUNTER — Ambulatory Visit (INDEPENDENT_AMBULATORY_CARE_PROVIDER_SITE_OTHER): Payer: Self-pay | Admitting: Internal Medicine

## 2021-05-18 ENCOUNTER — Other Ambulatory Visit: Payer: Self-pay

## 2021-05-18 ENCOUNTER — Encounter: Payer: Self-pay | Admitting: Internal Medicine

## 2021-05-18 ENCOUNTER — Ambulatory Visit: Payer: Self-pay

## 2021-05-18 DIAGNOSIS — A530 Latent syphilis, unspecified as early or late: Secondary | ICD-10-CM

## 2021-05-18 DIAGNOSIS — F17210 Nicotine dependence, cigarettes, uncomplicated: Secondary | ICD-10-CM

## 2021-05-18 DIAGNOSIS — E669 Obesity, unspecified: Secondary | ICD-10-CM | POA: Insufficient documentation

## 2021-05-18 DIAGNOSIS — F329 Major depressive disorder, single episode, unspecified: Secondary | ICD-10-CM

## 2021-05-18 DIAGNOSIS — B20 Human immunodeficiency virus [HIV] disease: Secondary | ICD-10-CM

## 2021-05-18 NOTE — Assessment & Plan Note (Signed)
His infection has been under good, long-term control since diagnosis several years ago.  He will get repeat lab work today.  We gave him a pill canister so that he can have some medication when he is away from home.  Follow-up in 6 months. ?
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
## 2021-05-18 NOTE — Assessment & Plan Note (Signed)
We will repeat his RPR today. 

## 2021-05-18 NOTE — Assessment & Plan Note (Signed)
His depression is in remission. 

## 2021-05-18 NOTE — Assessment & Plan Note (Signed)
I encouraged him to go ahead with his plan to quit smoking cigarettes. ?

## 2021-05-21 LAB — COMPREHENSIVE METABOLIC PANEL
AG Ratio: 1.2 (calc) (ref 1.0–2.5)
ALT: 31 U/L (ref 9–46)
AST: 28 U/L (ref 10–40)
Albumin: 4.2 g/dL (ref 3.6–5.1)
Alkaline phosphatase (APISO): 61 U/L (ref 36–130)
BUN: 14 mg/dL (ref 7–25)
CO2: 22 mmol/L (ref 20–32)
Calcium: 9.5 mg/dL (ref 8.6–10.3)
Chloride: 107 mmol/L (ref 98–110)
Creat: 1.01 mg/dL (ref 0.60–1.26)
Globulin: 3.5 g/dL (calc) (ref 1.9–3.7)
Glucose, Bld: 98 mg/dL (ref 65–99)
Potassium: 4.4 mmol/L (ref 3.5–5.3)
Sodium: 138 mmol/L (ref 135–146)
Total Bilirubin: 0.5 mg/dL (ref 0.2–1.2)
Total Protein: 7.7 g/dL (ref 6.1–8.1)

## 2021-05-21 LAB — HIV-1 RNA QUANT-NO REFLEX-BLD
HIV 1 RNA Quant: <20 Copies/mL — ABNORMAL HIGH
HIV-1 RNA Quant, Log: <1.3 Log cps/mL — ABNORMAL HIGH

## 2021-05-21 LAB — LIPID PANEL
Cholesterol: 168 mg/dL (ref <200)
HDL: 39 mg/dL — ABNORMAL LOW (ref >=40)
LDL Cholesterol (Calc): 109 mg/dL (calc) — ABNORMAL HIGH
Non-HDL Cholesterol (Calc): 129 mg/dL (calc) (ref <130)
Total CHOL/HDL Ratio: 4.3 (calc) (ref <5.0)
Triglycerides: 108 mg/dL (ref <150)

## 2021-05-21 LAB — T-HELPER CELLS (CD4) COUNT (NOT AT ARMC)
Absolute CD4: 767 cells/uL (ref 490–1740)
CD4 T Helper %: 38 % (ref 30–61)
Total lymphocyte count: 2025 cells/uL (ref 850–3900)

## 2021-05-21 LAB — CBC
HCT: 43.1 % (ref 38.5–50.0)
Hemoglobin: 15.2 g/dL (ref 13.2–17.1)
MCH: 32.2 pg (ref 27.0–33.0)
MCHC: 35.3 g/dL (ref 32.0–36.0)
MCV: 91.3 fL (ref 80.0–100.0)
MPV: 10.5 fL (ref 7.5–12.5)
Platelets: 317 10*3/uL (ref 140–400)
RBC: 4.72 10*6/uL (ref 4.20–5.80)
RDW: 14.4 % (ref 11.0–15.0)
WBC: 5.3 10*3/uL (ref 3.8–10.8)

## 2021-05-21 LAB — RPR: RPR Ser Ql: NONREACTIVE

## 2021-05-24 NOTE — Telephone Encounter (Signed)
Can he get samples or will they do an emergency supply? He just had it filled on 05/16/21? ?

## 2021-06-02 ENCOUNTER — Other Ambulatory Visit: Payer: Self-pay | Admitting: Pharmacist

## 2021-06-02 DIAGNOSIS — B20 Human immunodeficiency virus [HIV] disease: Secondary | ICD-10-CM

## 2021-06-02 MED ORDER — BICTEGRAVIR-EMTRICITAB-TENOFOV 50-200-25 MG PO TABS: 1.0000 | ORAL_TABLET | Freq: Every day | ORAL | 0 refills | Status: AC

## 2021-06-02 NOTE — Progress Notes (Signed)
Medication Samples have been provided to the patient. ? ?Drug name: Biktarvy        ?Strength: 50/200/25 mg       ?Qty: 21 tablets (3 bottles) ?LOT: [id number]   ?Exp.Date: 10/24 ? ?Dosing instructions: Take one tablet by mouth once daily ? ?The patient has been instructed regarding the correct time, dose, and frequency of taking this medication, including desired effects and most common side effects.  ? ?Margarite Gouge, PharmD, CPP ?Clinical Pharmacist Practitioner ?Infectious Diseases Clinical Pharmacist ?[facility] ? ?

## 2021-07-12 ENCOUNTER — Ambulatory Visit: Payer: Self-pay | Admitting: Internal Medicine

## 2021-07-17 ENCOUNTER — Other Ambulatory Visit: Payer: Self-pay | Admitting: Internal Medicine

## 2021-07-17 DIAGNOSIS — B20 Human immunodeficiency virus [HIV] disease: Secondary | ICD-10-CM

## 2021-09-29 ENCOUNTER — Ambulatory Visit (HOSPITAL_COMMUNITY)
Admission: EM | Admit: 2021-09-29 | Discharge: 2021-09-29 | Disposition: A | Discharge status: Home / Self Care | Payer: Self-pay

## 2021-09-29 ENCOUNTER — Encounter (HOSPITAL_COMMUNITY): Payer: Self-pay

## 2021-09-29 DIAGNOSIS — M79671 Pain in right foot: Secondary | ICD-10-CM

## 2021-09-29 DIAGNOSIS — M79672 Pain in left foot: Secondary | ICD-10-CM

## 2021-09-29 NOTE — Discharge Instructions (Addendum)
I recommend taking 800 mg ibuprofen every 6 hours. This can help with pain and inflammation in the feet. Make sure you are wearing supportive shoes at work. You can elevate the feet at home to help with any swelling.  Try to cut back on the alcohol use as well.  Please follow up with the orthopedic specialists if symptoms persist. Any worsening symptoms, please go to the emergency department.
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
## 2021-09-29 NOTE — ED Triage Notes (Signed)
[facility] with [facility] of bilateral big toe pain that started 3 days ago. Pt reports he attempted some home remedies for potential gout admits he has been drinking a lot not sure if it is gout no pmh of gout.

## 2021-11-23 ENCOUNTER — Ambulatory Visit: Payer: Self-pay | Admitting: Internal Medicine

## 2021-11-30 ENCOUNTER — Ambulatory Visit: Payer: Self-pay

## 2021-11-30 ENCOUNTER — Other Ambulatory Visit: Payer: Self-pay

## 2021-11-30 ENCOUNTER — Ambulatory Visit (INDEPENDENT_AMBULATORY_CARE_PROVIDER_SITE_OTHER): Payer: Self-pay | Admitting: Internal Medicine

## 2021-11-30 ENCOUNTER — Encounter: Payer: Self-pay | Admitting: Internal Medicine

## 2021-11-30 DIAGNOSIS — B20 Human immunodeficiency virus [HIV] disease: Secondary | ICD-10-CM

## 2021-11-30 DIAGNOSIS — F17210 Nicotine dependence, cigarettes, uncomplicated: Secondary | ICD-10-CM

## 2021-11-30 DIAGNOSIS — R635 Abnormal weight gain: Secondary | ICD-10-CM

## 2021-11-30 MED ORDER — BIKTARVY 50-200-25 MG PO TABS: 1.0000 | ORAL_TABLET | Freq: Every day | ORAL | 11 refills | Status: DC

## 2021-11-30 NOTE — Assessment & Plan Note (Signed)
I encouraged him to go through with his plan to get back to regular exercise.  He is motivated to exercise in hopes that it well set a good example for his 36 year old son.

## 2021-11-30 NOTE — Assessment & Plan Note (Signed)
He is making some progress on quitting completely.  I encouraged him to continue his efforts and to enlist the support of friends and families to help him quit.
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
## 2021-11-30 NOTE — Assessment & Plan Note (Signed)
His infection has been under very good, long-term control.  He will get repeat lab work today, continue [place] and follow-up in 6 months.

## 2021-12-01 LAB — T-HELPER CELLS (CD4) COUNT (NOT AT ARMC)
CD4 % Helper T Cell: 43 % (ref 33–65)
CD4 T Cell Abs: 830 /uL (ref 400–1790)

## 2021-12-03 LAB — HIV-1 RNA QUANT-NO REFLEX-BLD
HIV 1 RNA Quant: NOT DETECTED Copies/mL
HIV-1 RNA Quant, Log: NOT DETECTED Log cps/mL

## 2022-01-11 ENCOUNTER — Other Ambulatory Visit: Payer: Self-pay | Admitting: Internal Medicine

## 2022-01-11 DIAGNOSIS — B20 Human immunodeficiency virus [HIV] disease: Secondary | ICD-10-CM

## 2022-03-01 ENCOUNTER — Other Ambulatory Visit (HOSPITAL_COMMUNITY): Payer: Self-pay

## 2022-05-02 NOTE — Progress Notes (Signed)
Error

## 2022-05-31 ENCOUNTER — Ambulatory Visit (INDEPENDENT_AMBULATORY_CARE_PROVIDER_SITE_OTHER): Payer: Self-pay

## 2022-05-31 ENCOUNTER — Encounter: Payer: Self-pay | Admitting: Internal Medicine

## 2022-05-31 ENCOUNTER — Ambulatory Visit (INDEPENDENT_AMBULATORY_CARE_PROVIDER_SITE_OTHER): Payer: Medicaid Other | Admitting: Internal Medicine

## 2022-05-31 ENCOUNTER — Other Ambulatory Visit: Payer: Self-pay

## 2022-05-31 VITALS — BP 128/82 | HR 65 | Temp 98.2°F | Ht 64.0 in | Wt 220.0 lb

## 2022-05-31 DIAGNOSIS — B20 Human immunodeficiency virus [HIV] disease: Secondary | ICD-10-CM

## 2022-05-31 DIAGNOSIS — Z23 Encounter for immunization: Secondary | ICD-10-CM

## 2022-05-31 MED ORDER — BIKTARVY 50-200-25 MG PO TABS: 1.0000 | ORAL_TABLET | Freq: Every day | ORAL | 11 refills | Status: DC

## 2022-05-31 NOTE — Assessment & Plan Note (Signed)
His infection remains under excellent, long-term control.  He will continue Biktarvy.  He received his updated COVID-vaccine and a Prevnar vaccine here today.  He wants to continue 66-month follow-up.

## 2022-05-31 NOTE — Addendum Note (Signed)
Addended by: Lucie Leather D on: 05/31/2022 09:08 AM   Modules accepted: Orders
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
## 2022-06-01 LAB — T-HELPER CELLS (CD4) COUNT (NOT AT ARMC)
CD4 % Helper T Cell: 43 % (ref 33–65)
CD4 T Cell Abs: 834 /uL (ref 400–1790)

## 2022-06-02 LAB — COMPREHENSIVE METABOLIC PANEL
AG Ratio: 1.6 (calc) (ref 1.0–2.5)
ALT: 46 U/L (ref 9–46)
AST: 32 U/L (ref 10–40)
Albumin: 4.4 g/dL (ref 3.6–5.1)
Alkaline phosphatase (APISO): 56 U/L (ref 36–130)
BUN: 10 mg/dL (ref 7–25)
CO2: 24 mmol/L (ref 20–32)
Calcium: 9.5 mg/dL (ref 8.6–10.3)
Chloride: 107 mmol/L (ref 98–110)
Creat: 0.99 mg/dL (ref 0.60–1.26)
Globulin: 2.8 g/dL (calc) (ref 1.9–3.7)
Glucose, Bld: 93 mg/dL (ref 65–99)
Potassium: 4.3 mmol/L (ref 3.5–5.3)
Sodium: 141 mmol/L (ref 135–146)
Total Bilirubin: 0.4 mg/dL (ref 0.2–1.2)
Total Protein: 7.2 g/dL (ref 6.1–8.1)

## 2022-06-02 LAB — LIPID PANEL
Cholesterol: 134 mg/dL (ref <200)
HDL: 35 mg/dL — ABNORMAL LOW (ref >=40)
LDL Cholesterol (Calc): 81 mg/dL (calc)
Non-HDL Cholesterol (Calc): 99 mg/dL (calc) (ref <130)
Total CHOL/HDL Ratio: 3.8 (calc) (ref <5.0)
Triglycerides: 96 mg/dL (ref <150)

## 2022-06-02 LAB — CBC
HCT: 40.5 % (ref 38.5–50.0)
Hemoglobin: 13.7 g/dL (ref 13.2–17.1)
MCH: 31.6 pg (ref 27.0–33.0)
MCHC: 33.8 g/dL (ref 32.0–36.0)
MCV: 93.5 fL (ref 80.0–100.0)
MPV: 10 fL (ref 7.5–12.5)
Platelets: 342 10*3/uL (ref 140–400)
RBC: 4.33 10*6/uL (ref 4.20–5.80)
RDW: 12.3 % (ref 11.0–15.0)
WBC: 4.5 10*3/uL (ref 3.8–10.8)

## 2022-06-02 LAB — HIV-1 RNA QUANT-NO REFLEX-BLD
HIV 1 RNA Quant: <20 Copies/mL — ABNORMAL HIGH
HIV-1 RNA Quant, Log: <1.3 Log cps/mL — ABNORMAL HIGH

## 2022-06-02 LAB — RPR: RPR Ser Ql: REACTIVE — AB

## 2022-06-02 LAB — T PALLIDUM AB: T Pallidum Abs: POSITIVE — AB

## 2022-06-02 LAB — RPR TITER: RPR Titer: 1:1 {titer} — ABNORMAL HIGH

## 2022-06-05 ENCOUNTER — Telehealth: Payer: Self-pay

## 2022-06-05 NOTE — Telephone Encounter (Signed)
Patient scheduled for 3/26 at 3pm for Bicillin 2.4 million units IM x1

## 2022-06-05 NOTE — Telephone Encounter (Signed)
Patient call concerned about RPR results. Patient educated regarding STI transmission, Health Department follow up. Advised him to abstain from any sexual interaction for 7-10 days after treatment.  Routing to provider for treatment orders

## 2022-06-06 ENCOUNTER — Ambulatory Visit (INDEPENDENT_AMBULATORY_CARE_PROVIDER_SITE_OTHER): Payer: Self-pay

## 2022-06-06 ENCOUNTER — Other Ambulatory Visit: Payer: Self-pay

## 2022-06-06 DIAGNOSIS — A530 Latent syphilis, unspecified as early or late: Secondary | ICD-10-CM

## 2022-06-06 MED ORDER — PENICILLIN G BENZATHINE 1200000 UNIT/2ML IM SUSY
2.4000 10*6.[IU] | PREFILLED_SYRINGE | Freq: Once | INTRAMUSCULAR | Status: AC
  Administered 2022-06-06: 2.4 10*6.[IU] via INTRAMUSCULAR

## 2022-06-07 ENCOUNTER — Ambulatory Visit: Payer: Self-pay

## 2022-12-05 ENCOUNTER — Ambulatory Visit: Payer: Medicaid Other | Admitting: Internal Medicine

## 2022-12-13 ENCOUNTER — Ambulatory Visit: Payer: Medicaid Other | Admitting: Infectious Diseases

## 2022-12-19 ENCOUNTER — Ambulatory Visit: Payer: Medicaid Other | Admitting: Infectious Diseases

## 2022-12-19 ENCOUNTER — Other Ambulatory Visit (HOSPITAL_COMMUNITY)
Admission: RE | Admit: 2022-12-19 | Discharge: 2022-12-19 | Disposition: A | Discharge status: Home / Self Care | Payer: Medicaid Other | Source: Ambulatory Visit | Attending: Infectious Diseases | Admitting: Infectious Diseases

## 2022-12-19 ENCOUNTER — Other Ambulatory Visit: Payer: Self-pay

## 2022-12-19 ENCOUNTER — Encounter: Payer: Self-pay | Admitting: Infectious Diseases

## 2022-12-19 VITALS — BP 137/88 | HR 88 | Resp 16 | Ht 64.0 in | Wt 208.0 lb

## 2022-12-19 DIAGNOSIS — Z23 Encounter for immunization: Secondary | ICD-10-CM | POA: Diagnosis not present

## 2022-12-19 DIAGNOSIS — Z5181 Encounter for therapeutic drug level monitoring: Secondary | ICD-10-CM

## 2022-12-19 DIAGNOSIS — Z113 Encounter for screening for infections with a predominantly sexual mode of transmission: Secondary | ICD-10-CM | POA: Insufficient documentation

## 2022-12-19 DIAGNOSIS — Z0000 Encounter for general adult medical examination without abnormal findings: Secondary | ICD-10-CM

## 2022-12-19 DIAGNOSIS — B20 Human immunodeficiency virus [HIV] disease: Secondary | ICD-10-CM | POA: Diagnosis present

## 2022-12-19 MED ORDER — BIKTARVY 50-200-25 MG PO TABS
1.0000 | ORAL_TABLET | Freq: Every day | ORAL | 11 refills | Status: DC
Start: 2022-12-19 — End: 2023-06-05
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
## 2022-12-20 LAB — URINE CYTOLOGY ANCILLARY ONLY
Chlamydia: NEGATIVE
Comment: NEGATIVE
Comment: NORMAL
Neisseria Gonorrhea: NEGATIVE

## 2022-12-20 LAB — T-HELPER CELLS (CD4) COUNT (NOT AT ARMC)
CD4 % Helper T Cell: 51 % (ref 33–65)
CD4 T Cell Abs: 1112 /uL (ref 400–1790)

## 2022-12-22 LAB — COMPREHENSIVE METABOLIC PANEL
AG Ratio: 1.5 (calc) (ref 1.0–2.5)
ALT: 23 U/L (ref 9–46)
AST: 24 U/L (ref 10–40)
Albumin: 4.4 g/dL (ref 3.6–5.1)
Alkaline phosphatase (APISO): 68 U/L (ref 36–130)
BUN: 9 mg/dL (ref 7–25)
CO2: 27 mmol/L (ref 20–32)
Calcium: 9.8 mg/dL (ref 8.6–10.3)
Chloride: 109 mmol/L (ref 98–110)
Creat: 1.07 mg/dL (ref 0.60–1.26)
Globulin: 3 g/dL (ref 1.9–3.7)
Glucose, Bld: 89 mg/dL (ref 65–99)
Potassium: 4.2 mmol/L (ref 3.5–5.3)
Sodium: 142 mmol/L (ref 135–146)
Total Bilirubin: 0.3 mg/dL (ref 0.2–1.2)
Total Protein: 7.4 g/dL (ref 6.1–8.1)

## 2022-12-22 LAB — HIV RNA, RTPCR W/R GT (RTI, PI,INT)
HIV 1 RNA Quant: NOT DETECTED {copies}/mL
HIV-1 RNA Quant, Log: NOT DETECTED {Log}

## 2022-12-22 LAB — RPR: RPR Ser Ql: NONREACTIVE

## 2022-12-29 NOTE — Addendum Note (Signed)
Addended by: Odette Fraction on: 12/29/2022 10:01 AM   Modules accepted: Level of Service

## 2023-03-20 ENCOUNTER — Telehealth: Payer: Self-pay

## 2023-03-20 NOTE — Telephone Encounter (Signed)
 Patient called office for addition advise regarding hic PCP visit. States that he was seen due to concerns he was having with rectal area. Has noticed a small bump near rectum for the past two weeks. Denies any pain. Only notices slight burning when he wipes area. Has noticed more bumps in area and some discharge with fishy smell. Feels like this is a result from using dirty Toilet.   Was told at [facility] that this is hemorrhoids and advised he try soaking in warm water with Epsom salt.  Would like additional recommendations from Dr. Dea. Lorenda CHRISTELLA Code, RMA

## 2023-03-20 NOTE — Telephone Encounter (Signed)
 Spoke with patient and offered appointment this week. Due to his schedule cannot come in until after 4. Patient verbalized he will go to Urgent Care tomorrow for evaluation. Is scheduled to see Dr. Dea on 1/14.  Will call and cancel appt pending UC eval. Lorenda CHRISTELLA Code, RMA

## 2023-03-27 ENCOUNTER — Other Ambulatory Visit: Payer: Self-pay

## 2023-03-27 ENCOUNTER — Encounter: Payer: Self-pay | Admitting: Infectious Diseases

## 2023-03-27 ENCOUNTER — Ambulatory Visit (INDEPENDENT_AMBULATORY_CARE_PROVIDER_SITE_OTHER): Payer: Medicaid Other | Admitting: Infectious Diseases

## 2023-03-27 VITALS — BP 121/82 | HR 68 | Temp 98.8°F | Wt 208.0 lb

## 2023-03-27 DIAGNOSIS — Z113 Encounter for screening for infections with a predominantly sexual mode of transmission: Secondary | ICD-10-CM

## 2023-03-27 DIAGNOSIS — N5089 Other specified disorders of the male genital organs: Secondary | ICD-10-CM | POA: Diagnosis present

## 2023-03-27 DIAGNOSIS — Z79899 Other long term (current) drug therapy: Secondary | ICD-10-CM | POA: Insufficient documentation

## 2023-03-27 DIAGNOSIS — B20 Human immunodeficiency virus [HIV] disease: Secondary | ICD-10-CM | POA: Diagnosis not present

## 2023-03-27 MED ORDER — VALACYCLOVIR HCL 1 G PO TABS: 1000.0000 mg | ORAL_TABLET | Freq: Two times a day (BID) | ORAL | 0 refills | Status: AC

## 2023-03-27 MED ORDER — VALACYCLOVIR HCL 1 G PO TABS: 1000.0000 mg | ORAL_TABLET | Freq: Two times a day (BID) | ORAL | 0 refills | Status: DC
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
## 2023-03-28 LAB — C. TRACHOMATIS/N. GONORRHOEAE RNA
C. trachomatis RNA, TMA: NOT DETECTED
N. gonorrhoeae RNA, TMA: NOT DETECTED

## 2023-03-28 LAB — CT/NG RNA, TMA RECTAL
Chlamydia Trachomatis RNA: NOT DETECTED
Neisseria Gonorrhoeae RNA: NOT DETECTED

## 2023-03-28 LAB — GC/CHLAMYDIA PROBE, AMP (THROAT)
Chlamydia trachomatis RNA: NOT DETECTED
Neisseria gonorrhoeae RNA: NOT DETECTED

## 2023-03-29 ENCOUNTER — Telehealth: Payer: Self-pay

## 2023-03-29 LAB — SURESWAB HSV, TYPE 1/2 DNA, PCR
HSV 1 DNA: NOT DETECTED
HSV 2 DNA: NOT DETECTED

## 2023-03-29 NOTE — Telephone Encounter (Signed)
Patient called with concerns about reactive RPR results. Discussed that provider is waiting on titers to see if treatment is indicated.   [name], RN

## 2023-03-31 LAB — HIV RNA, RTPCR W/R GT (RTI, PI,INT)
HIV 1 RNA Quant: NOT DETECTED {copies}/mL
HIV-1 RNA Quant, Log: NOT DETECTED {Log}

## 2023-03-31 LAB — RPR TITER: RPR Titer: 1:64 {titer} — ABNORMAL HIGH

## 2023-03-31 LAB — T PALLIDUM AB: T Pallidum Abs: POSITIVE — AB

## 2023-03-31 LAB — RPR: RPR Ser Ql: REACTIVE — AB

## 2023-04-02 ENCOUNTER — Telehealth: Payer: Self-pay

## 2023-04-02 NOTE — Telephone Encounter (Signed)
-----   Message from Victoriano Lain sent at 04/02/2023  7:59 AM EST ----- Please let him know RPR titre has gone up to 1: 64 from prior value in 05/2022. Please give him one dose of benzathine penicillin 2.4 million units one dose. No signs and symptoms of neurosyphilis in last clinic visit.

## 2023-04-02 NOTE — Telephone Encounter (Signed)
Patient advised of positive syphilis test and verbalized understanding.  Scheduled for treatment 04/03/23. Tashon Capp Jonathon Resides, CMA

## 2023-04-03 ENCOUNTER — Other Ambulatory Visit: Payer: Self-pay

## 2023-04-03 ENCOUNTER — Ambulatory Visit (INDEPENDENT_AMBULATORY_CARE_PROVIDER_SITE_OTHER): Payer: Medicaid Other

## 2023-04-03 DIAGNOSIS — A530 Latent syphilis, unspecified as early or late: Secondary | ICD-10-CM | POA: Diagnosis present

## 2023-04-03 MED ORDER — PENICILLIN G BENZATHINE 1200000 UNIT/2ML IM SUSY
1.2000 10*6.[IU] | PREFILLED_SYRINGE | Freq: Once | INTRAMUSCULAR | Status: AC
  Administered 2023-04-03: 1.2 10*6.[IU] via INTRAMUSCULAR

## 2023-06-05 ENCOUNTER — Other Ambulatory Visit: Payer: Self-pay

## 2023-06-05 DIAGNOSIS — B20 Human immunodeficiency virus [HIV] disease: Secondary | ICD-10-CM

## 2023-06-05 MED ORDER — BIKTARVY 50-200-25 MG PO TABS
1.0000 | ORAL_TABLET | Freq: Every day | ORAL | 5 refills | Status: AC
Start: 2023-06-05 — End: ?

## 2023-07-24 ENCOUNTER — Ambulatory Visit (HOSPITAL_COMMUNITY)
Admission: EM | Admit: 2023-07-24 | Discharge: 2023-07-24 | Disposition: A | Discharge status: Home / Self Care | Attending: Family Medicine | Admitting: Family Medicine

## 2023-07-24 ENCOUNTER — Encounter (HOSPITAL_COMMUNITY): Payer: Self-pay | Admitting: *Deleted

## 2023-07-24 ENCOUNTER — Telehealth: Payer: Self-pay

## 2023-07-24 DIAGNOSIS — M79641 Pain in right hand: Secondary | ICD-10-CM

## 2023-07-24 DIAGNOSIS — S66901A Unspecified injury of unspecified muscle, fascia and tendon at wrist and hand level, right hand, initial encounter: Secondary | ICD-10-CM | POA: Diagnosis not present

## 2023-07-24 NOTE — Discharge Instructions (Addendum)
 Please call the hand specialist and your primary care about this issue.

## 2023-07-24 NOTE — ED Triage Notes (Signed)
 Pt states that he cut his right middle finger about a month ago, he wasn't seen at that time. He states he is now having right middle finger and hand pain. He states limited ROM.
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
## 2023-07-24 NOTE — Telephone Encounter (Signed)
 Patient called stating that he hurt his hand. Concerned it may be broken. Informed patient he would need to go to UC or ED to be evaluated and to have x-ray done. Patient voiced his understanding.    [name], CMA

## 2023-08-16 ENCOUNTER — Other Ambulatory Visit (HOSPITAL_COMMUNITY): Payer: Self-pay

## 2023-08-16 ENCOUNTER — Telehealth: Payer: Self-pay

## 2023-08-16 NOTE — Telephone Encounter (Signed)
 Patient called stating he misplaced his Biktarvy . Patient is not eligible for a refill until 08/20/23 per Jearlean Mince with our pharmacy team. Patient has already missed couple of day of his medication. Patient will come and get samples today.

## 2023-08-21 ENCOUNTER — Ambulatory Visit: Payer: Medicaid Other | Admitting: Infectious Diseases
